# Patient Record
Sex: Female | Born: 1940 | Race: White | Hispanic: No | Marital: Married | Smoking: Never smoker
Health system: Southern US, Community
[De-identification: ages and names within clinical notes are randomized; demographics above are authoritative.]

## PROBLEM LIST (undated history)

## (undated) DIAGNOSIS — R112 Nausea with vomiting, unspecified: Secondary | ICD-10-CM

## (undated) DIAGNOSIS — Z9889 Other specified postprocedural states: Secondary | ICD-10-CM

## (undated) DIAGNOSIS — I10 Essential (primary) hypertension: Secondary | ICD-10-CM

## (undated) DIAGNOSIS — H269 Unspecified cataract: Secondary | ICD-10-CM

## (undated) HISTORY — DX: Essential (primary) hypertension: I10

## (undated) HISTORY — PX: COLONOSCOPY: SHX174

## (undated) HISTORY — PX: TONSILLECTOMY: SUR1361

## (undated) HISTORY — PX: CHOLECYSTECTOMY: SHX55

## (undated) HISTORY — DX: Unspecified cataract: H26.9

---

## 2007-10-19 ENCOUNTER — Inpatient Hospital Stay (HOSPITAL_COMMUNITY): Admission: EM | Admit: 2007-10-19 | Discharge: 2007-10-20 | Payer: Self-pay | Admitting: Emergency Medicine

## 2007-10-19 IMAGING — CT CT ABDOMEN W/O CM
2 of 4 series · 17 of 46 positions shown, 19 images · non-contrast
Comparison: None available.

CT ABDOMEN

CLINICAL DATA: Back pain.

CT ABDOMEN AND PELVIS WITHOUT CONTRAST
TECHNIQUE: Multidetector CT imaging of the abdomen and pelvis was
performed following the standard
protocol without intravenous contrast.

[Series 2: 160 stone 5.0 b40f st · axial · 0.62mm/px · z∈[-260,+60]mm · 14 of 70 slices shown, 16 images]
[im 3/70  soft-tissue]
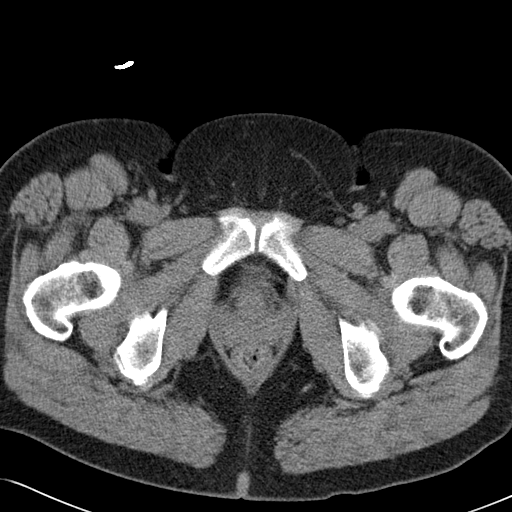
[im 3/70  bone]
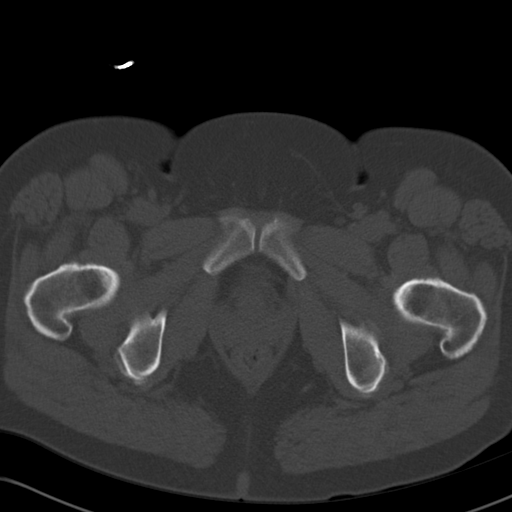
[im 8/70  soft-tissue]
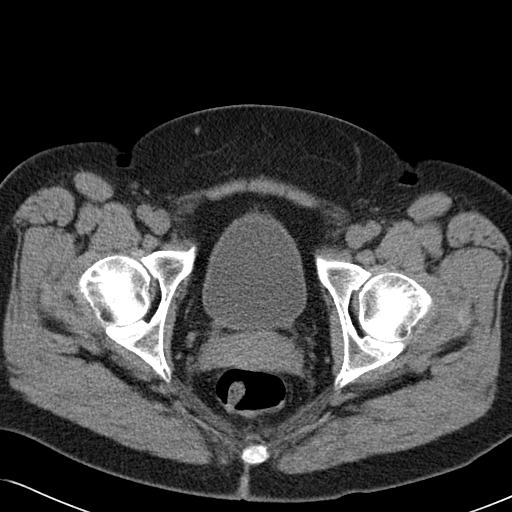
[im 13/70  soft-tissue]
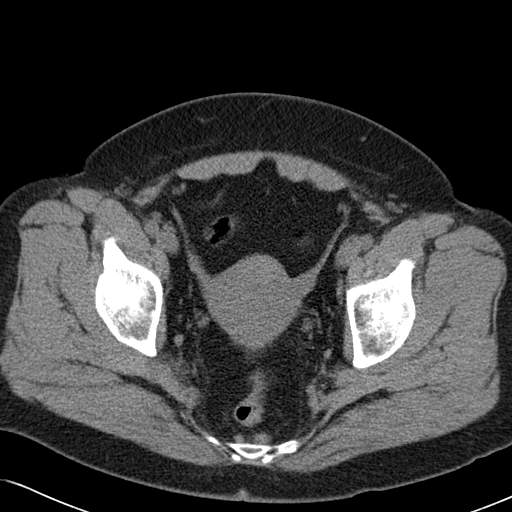
[im 18/70  soft-tissue]
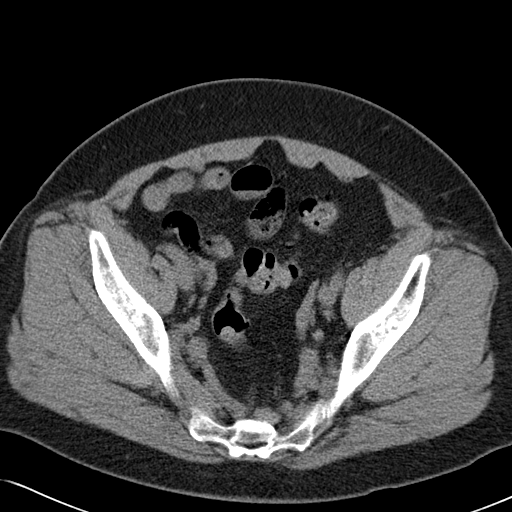
[im 24/70  soft-tissue]
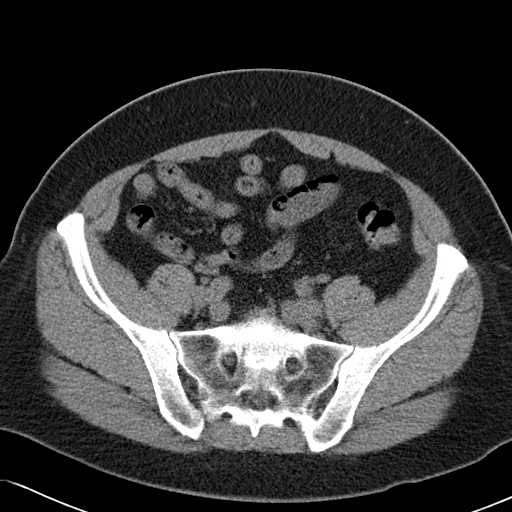
[im 29/70  soft-tissue]
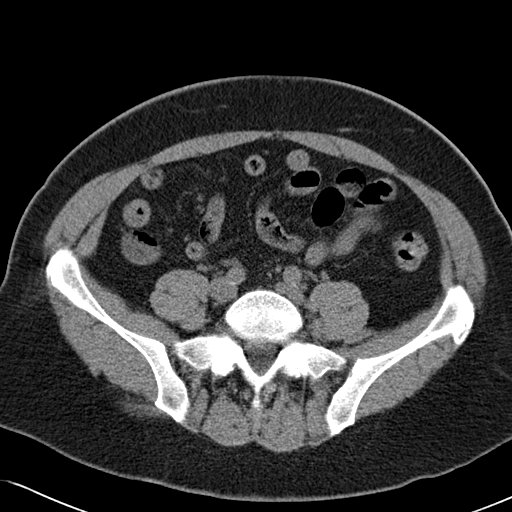
[im 34/70  soft-tissue]
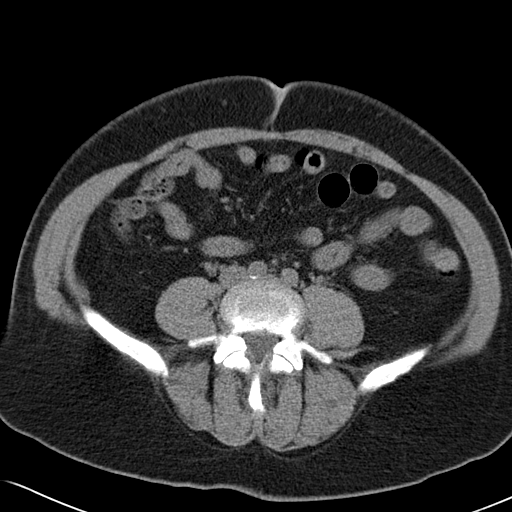
[im 36/70  soft-tissue]
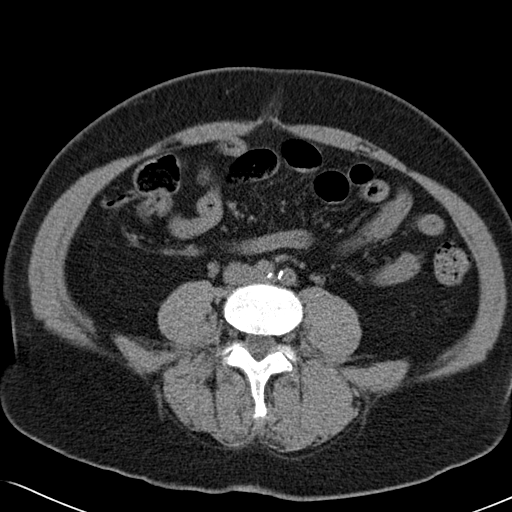
[im 41/70  soft-tissue]
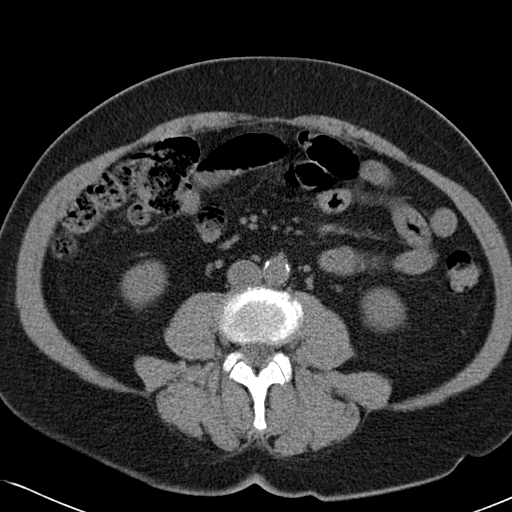
[im 41/70  bone]
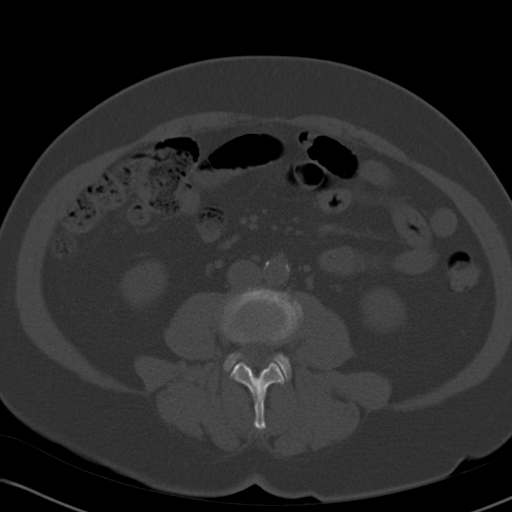
[im 47/70  soft-tissue]
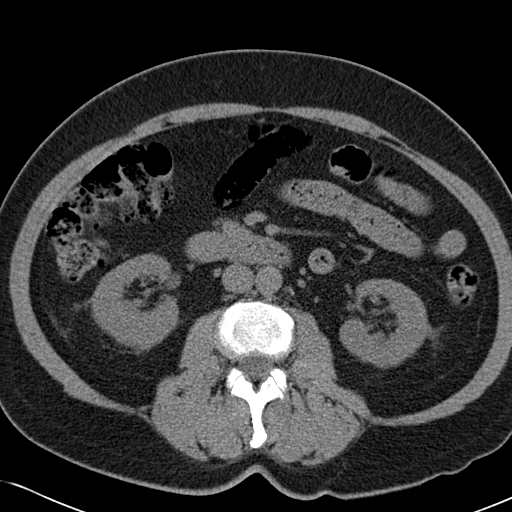
[im 52/70  soft-tissue]
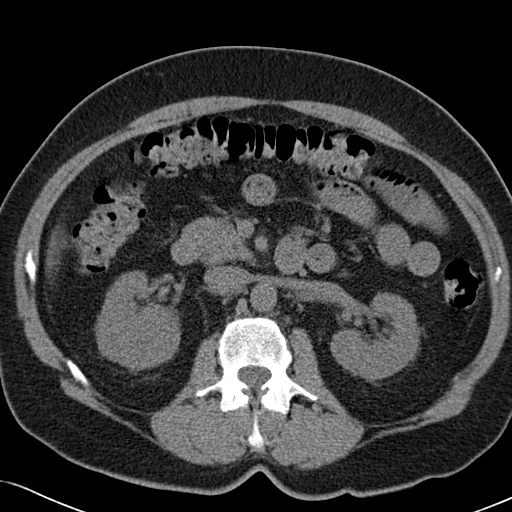
[im 57/70  soft-tissue]
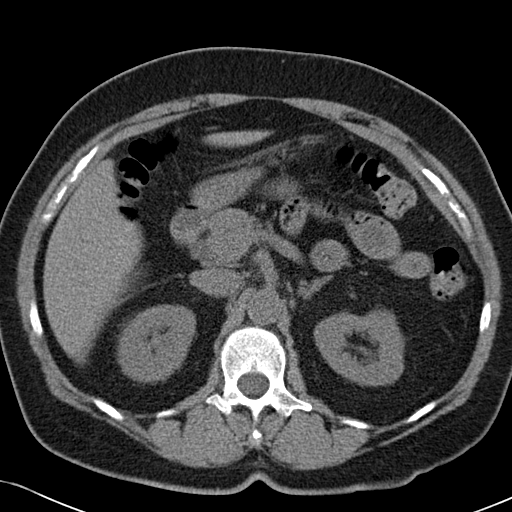
[im 62/70  soft-tissue]
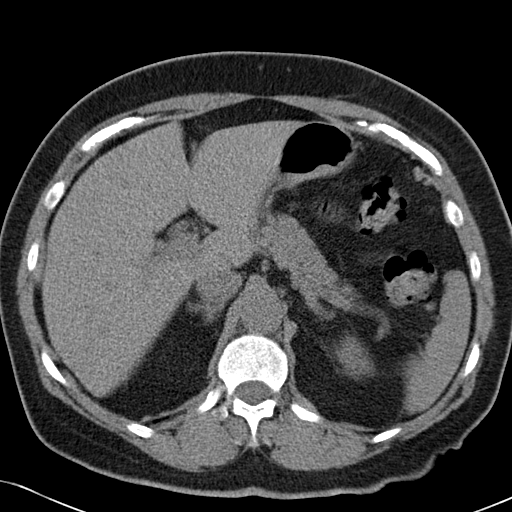
[im 67/70  soft-tissue]
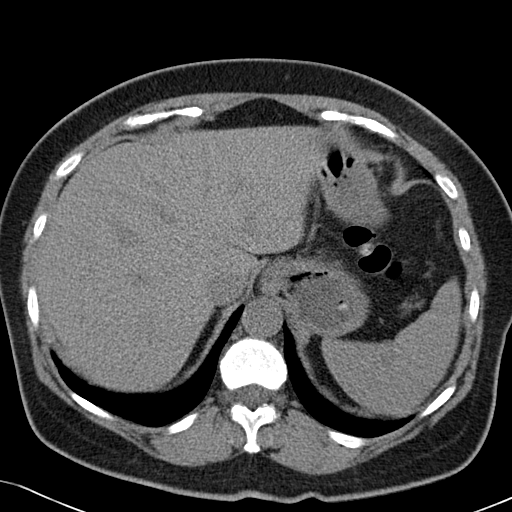

[Series 602: <mpr thick range> · coronal · 0.71mm/px · 3 of 72 slices shown]
[im 24/72  soft-tissue]
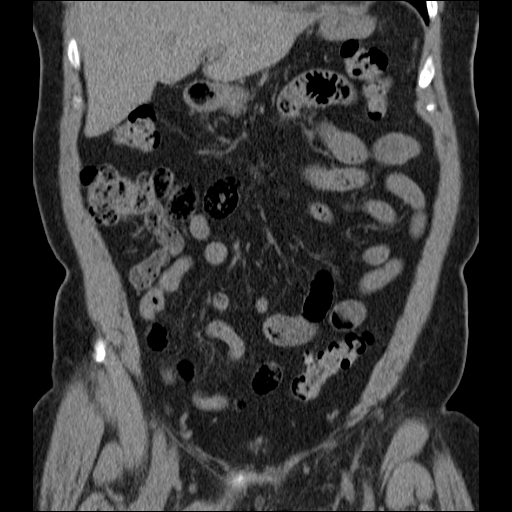
[im 32/72  soft-tissue]
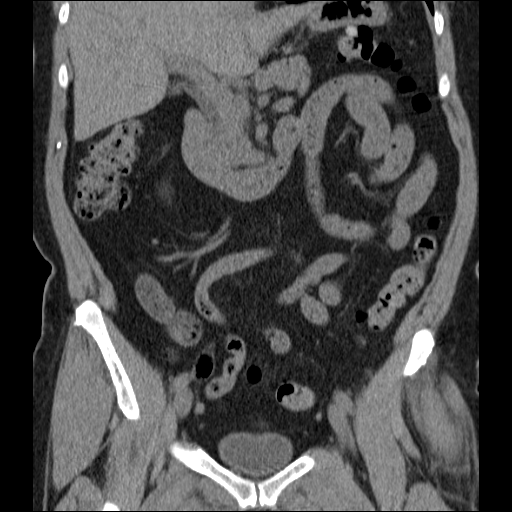
[im 40/72  soft-tissue]
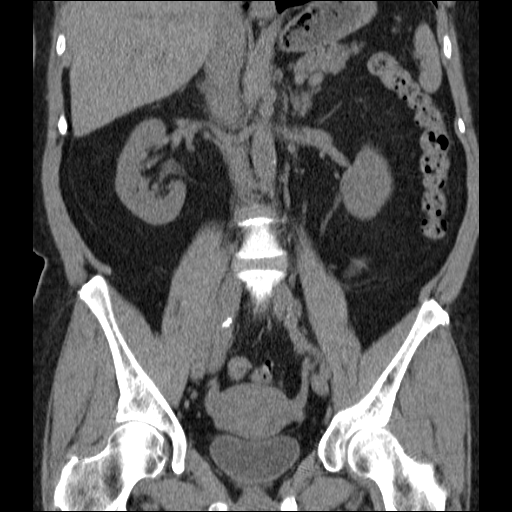

[17 of 46 positions shown; findings below may reference images not displayed]

FINDINGS: Minimal amount of lung parenchyma is imaged and is
clear.  No pleural effusion.

The kidneys and ureters have a normal uninfused appearance
bilaterally.  No hydronephrosis or urinary tract stones.  The
uninfused appearance of the imaged portions of the liver, spleen,
adrenal glands and pancreas is normal.  The patient is status post
cholecystectomy.  There is no abdominal lymphadenopathy or fluid
collection.  No focal bony abnormality.
IMPRESSION: Negative for urinary tract stones or hydronephrosis.  No acute
finding.

CT PELVIS
FINDINGS: The uterus and adnexa appear normal.  There is no pelvic
fluid or lymphadenopathy.  A few diverticula are seen in the
sigmoid colon but there is no evidence of diverticulitis.  No focal
bony abnormality.
IMPRESSION: Negative pelvic CT.

## 2007-10-19 IMAGING — CR DG CHEST 2V
2 series · 2 of 2 positions shown · non-contrast
Comparison: None available.

CLINICAL DATA: Back pain.

CHEST - 2 VIEW

[w chest pa]
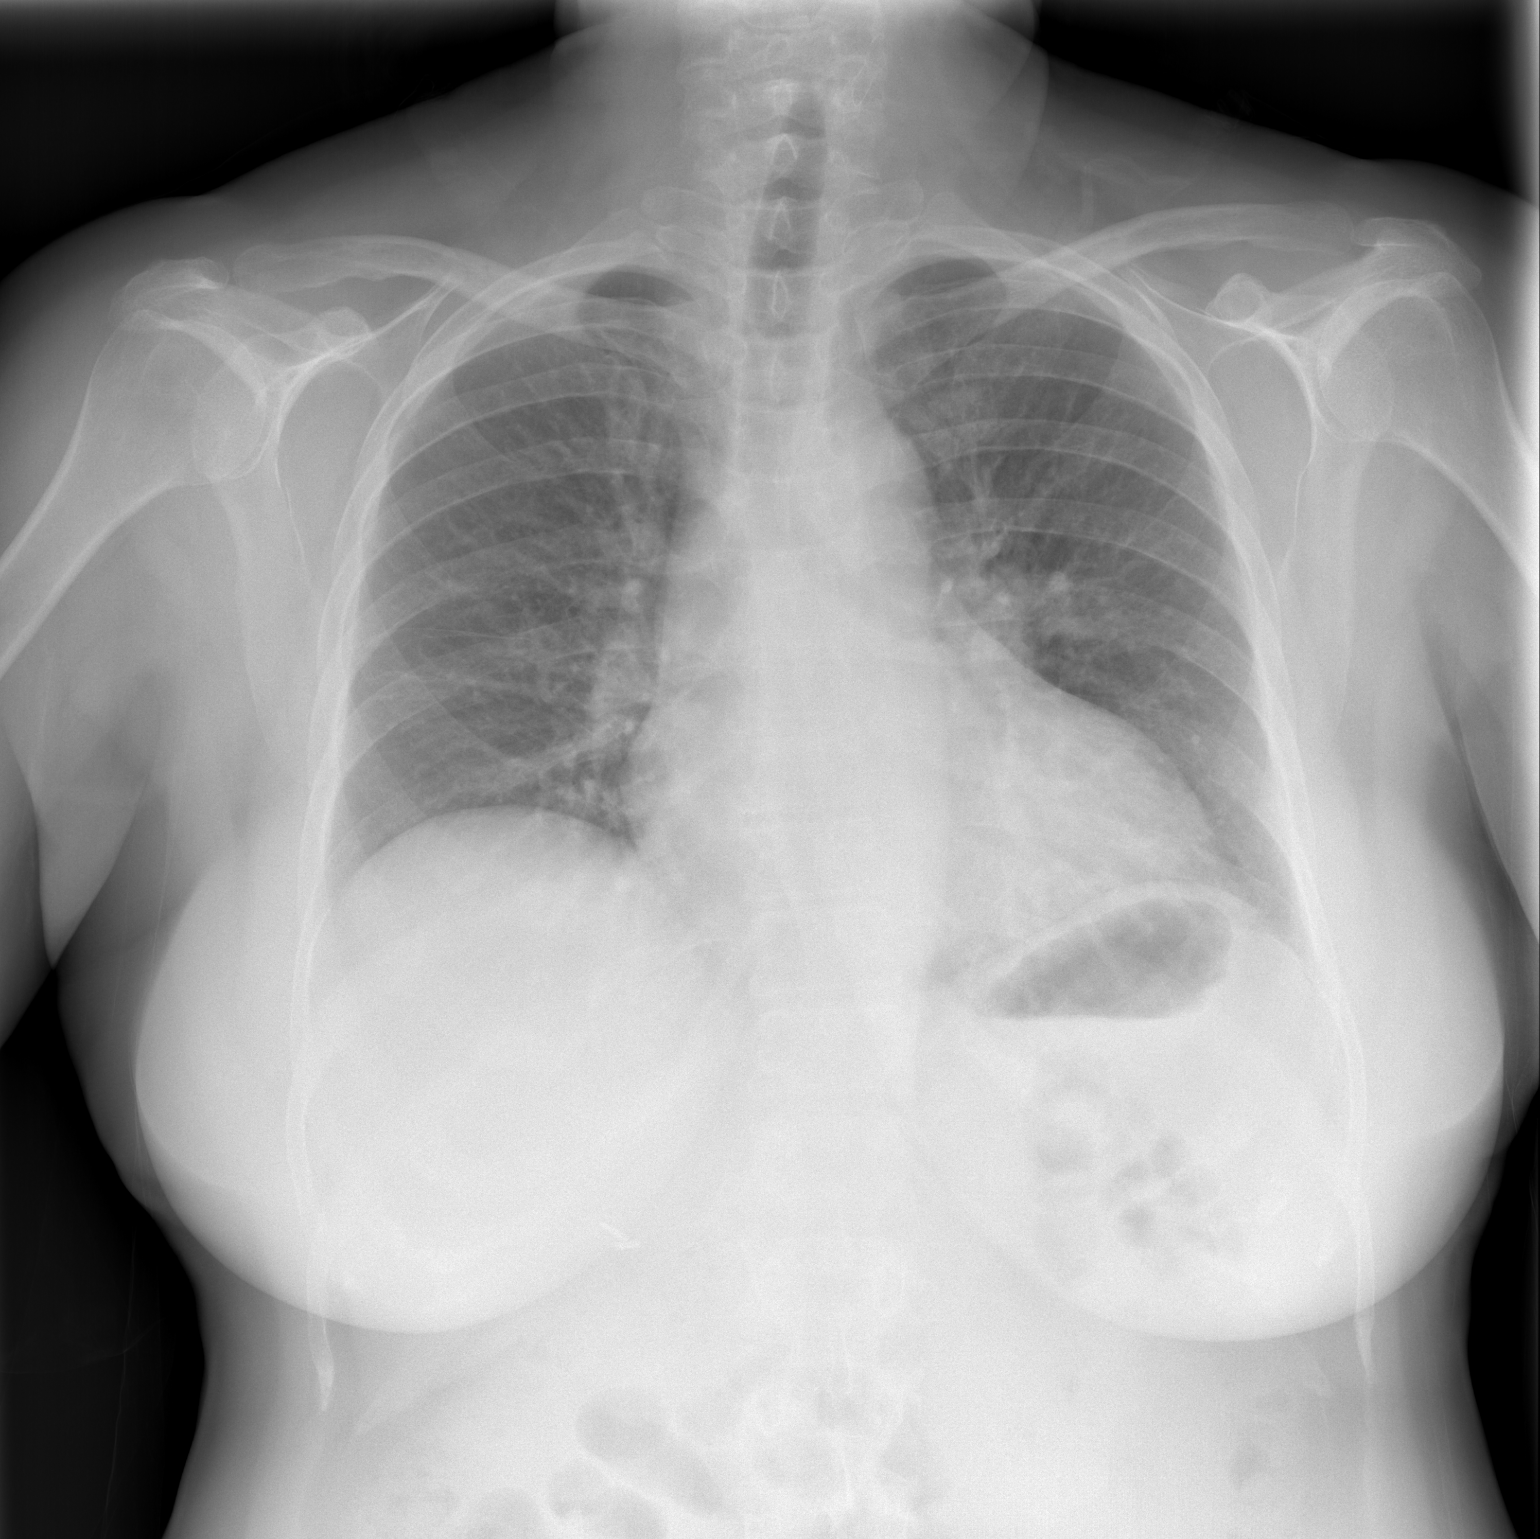

[w chest lat]
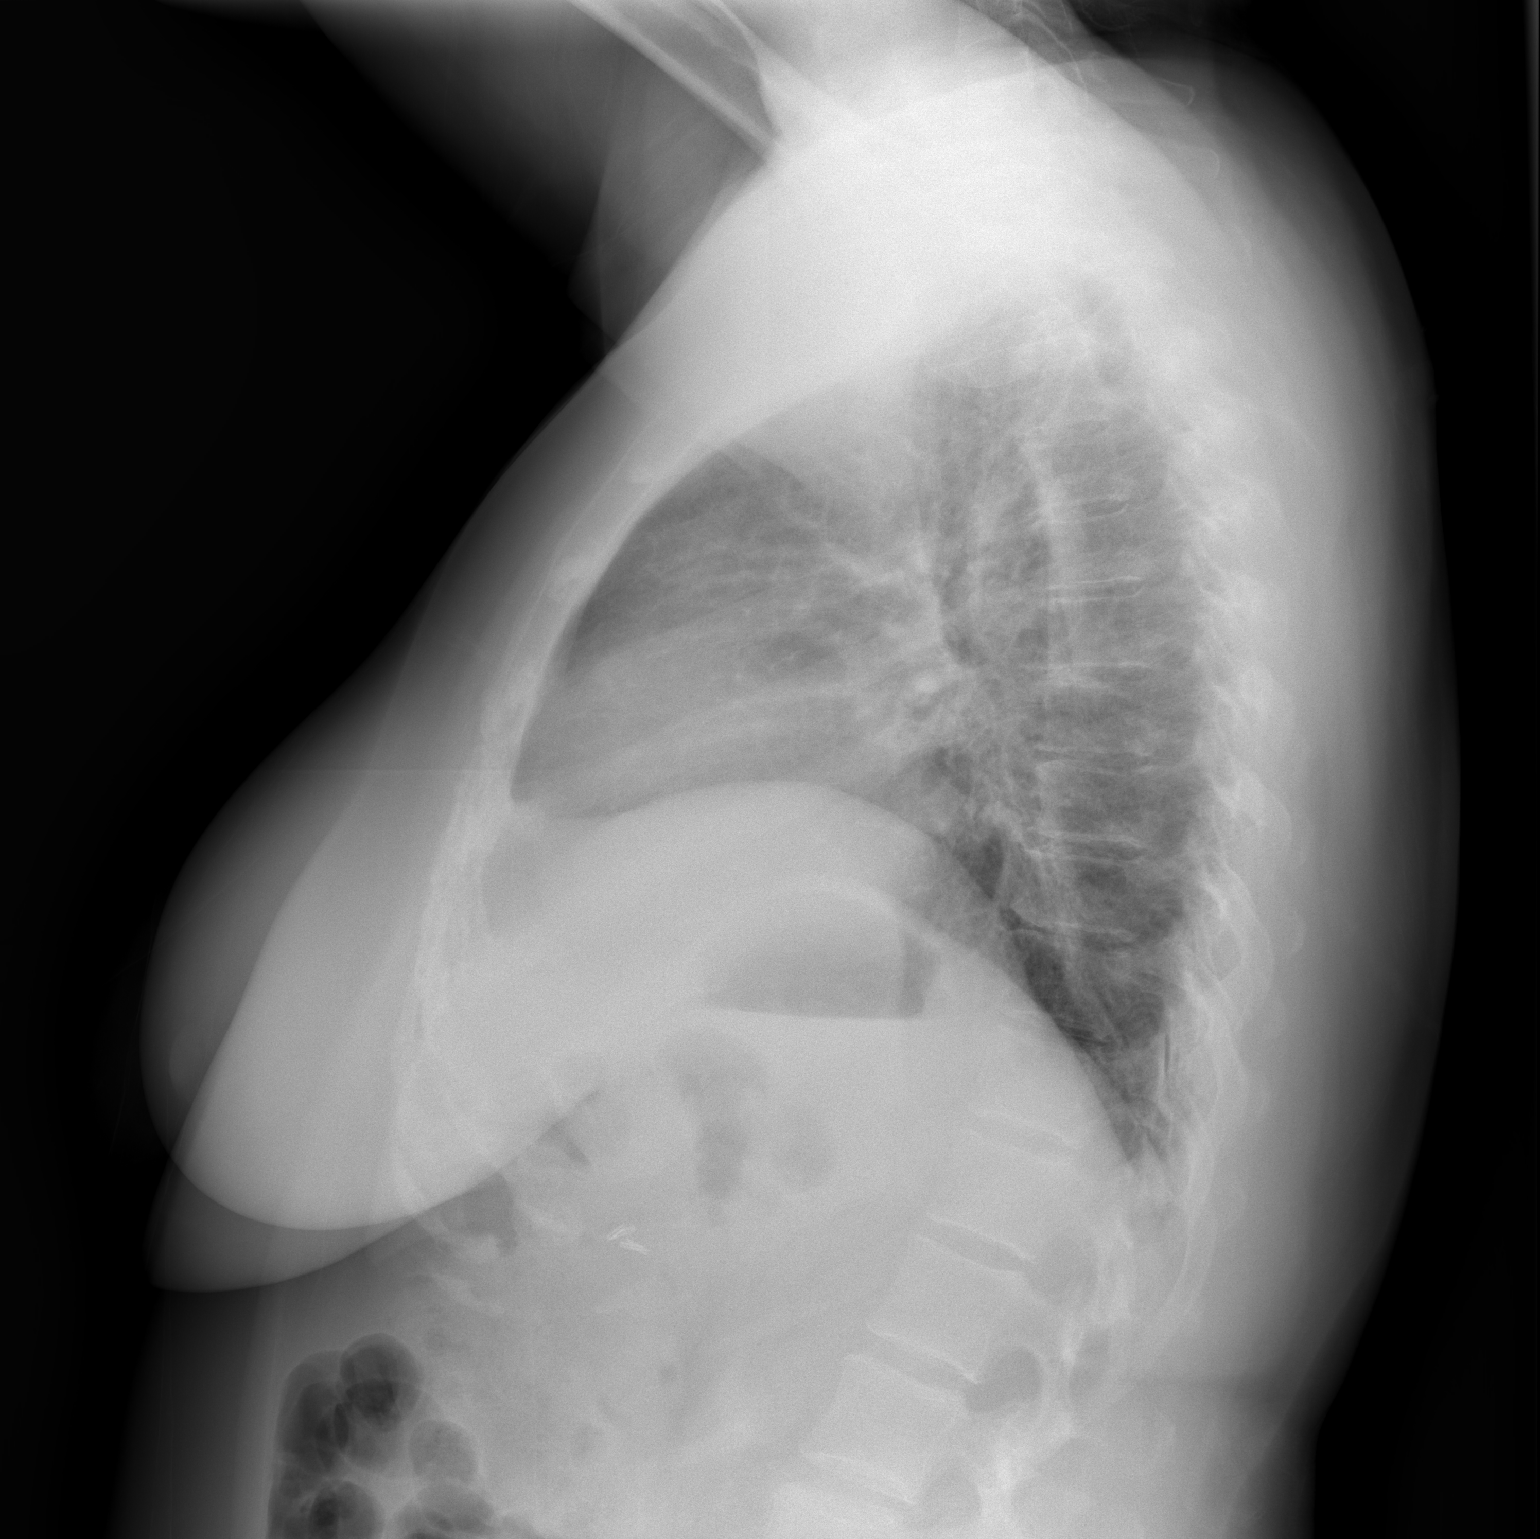

[2 of 2 positions shown; findings below may reference images not displayed]

FINDINGS: Lung volumes are low.  There is mild cardiomegaly
pulmonary vascular congestion.  No effusion or focal airspace
disease.
IMPRESSION: Cardiomegaly pulmonary vascular congestion.  Otherwise negative.

## 2007-10-19 IMAGING — US US ABDOMEN COMPLETE
1 series · 14 of 25 positions shown · non-contrast
Comparison: CT 10/19/2007.

CLINICAL DATA: History cholecystectomy.  Evaluate for retained
stones.

ABDOMEN ULTRASOUND
TECHNIQUE: Complete abdominal ultrasound examination was performed
including evaluation of the liver, gallbladder, bile ducts,
pancreas, kidneys, spleen, IVC, and abdominal aorta.

[Series 1: unknown · 0.37mm/px · 14 of 48 slices shown]
[im 1/48]
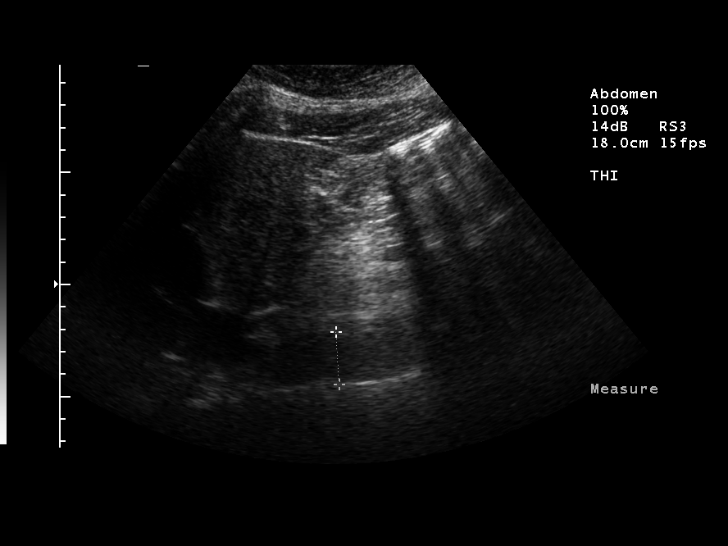
[im 4/48]
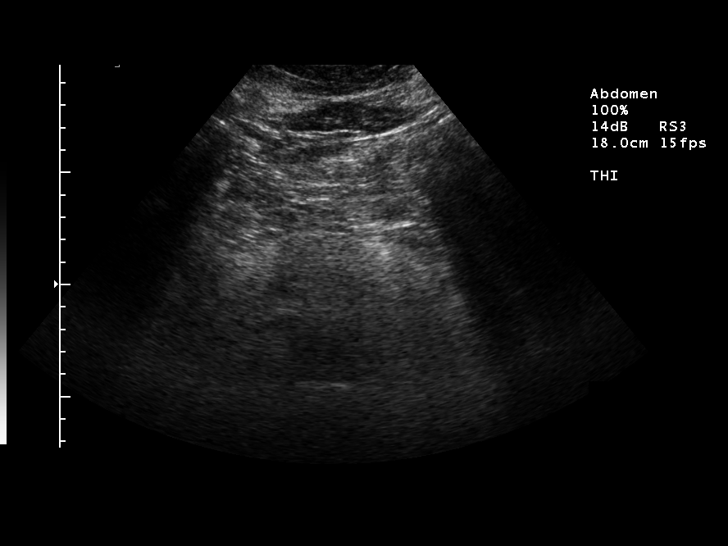
[im 8/48]
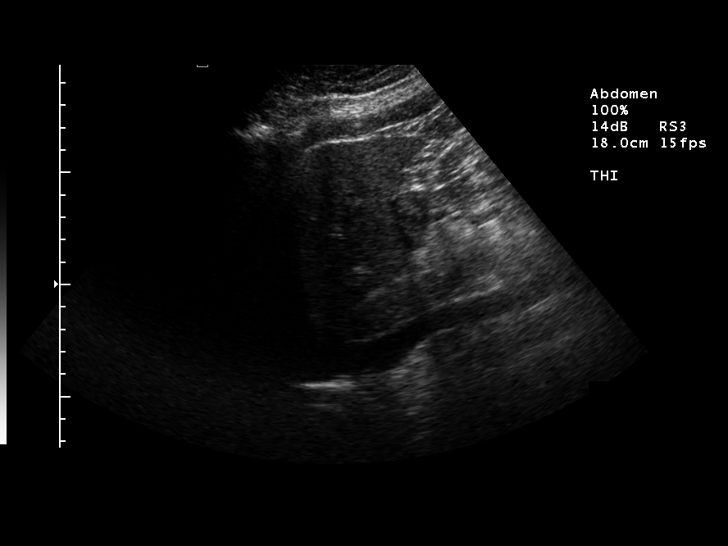
[im 12/48]
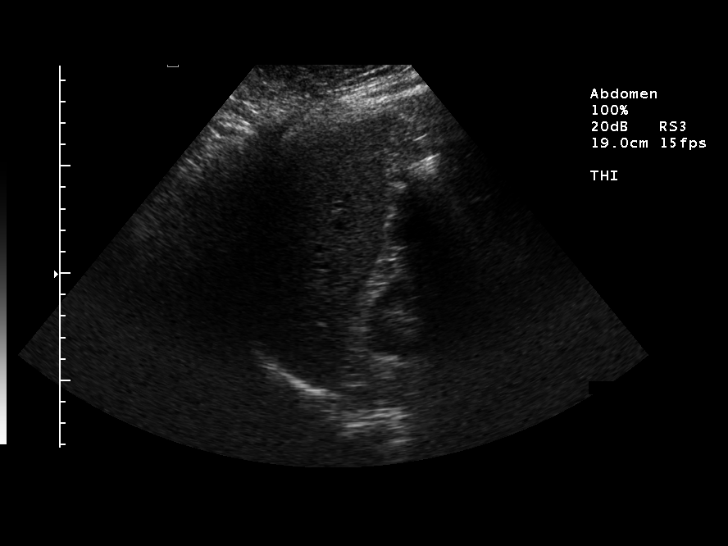
[im 16/48]
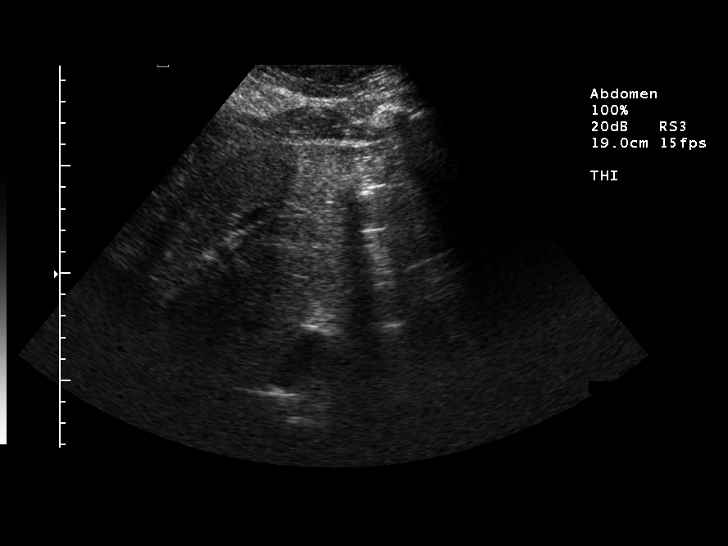
[im 18/48]
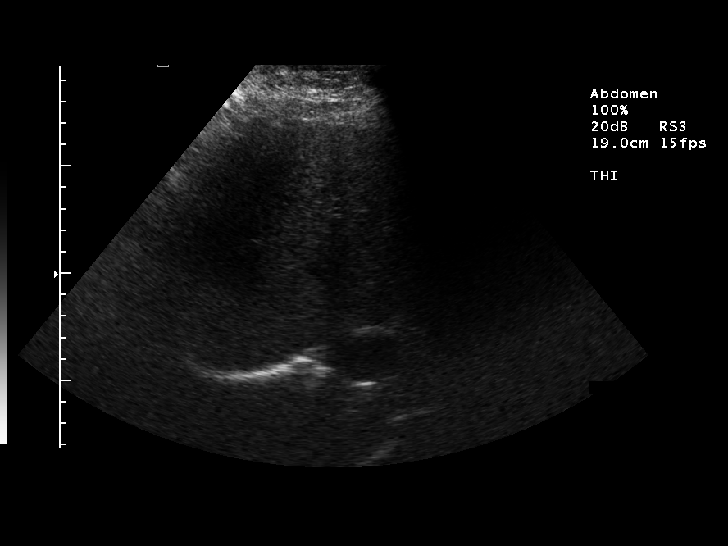
[im 22/48]
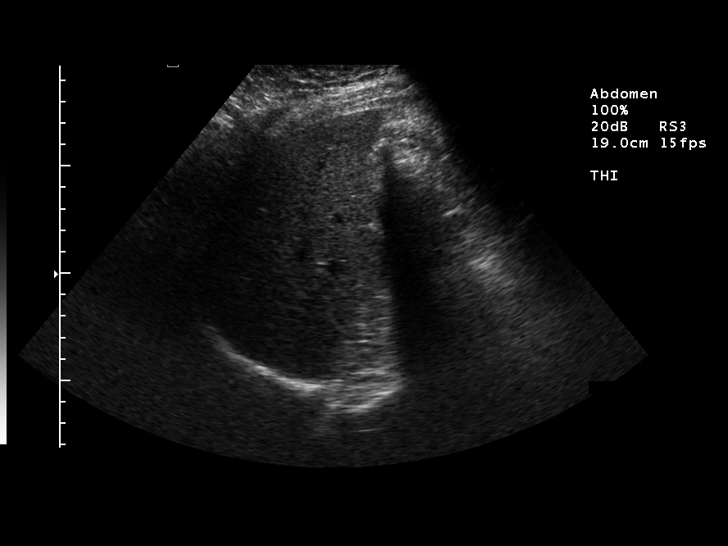
[im 26/48]
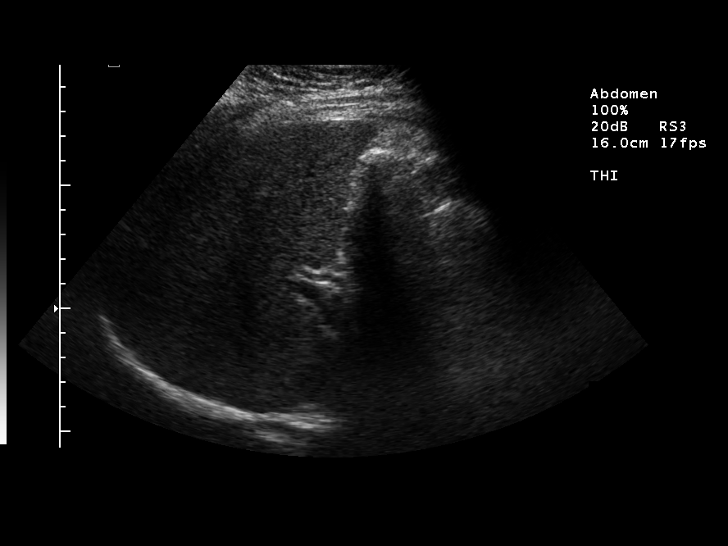
[im 30/48]
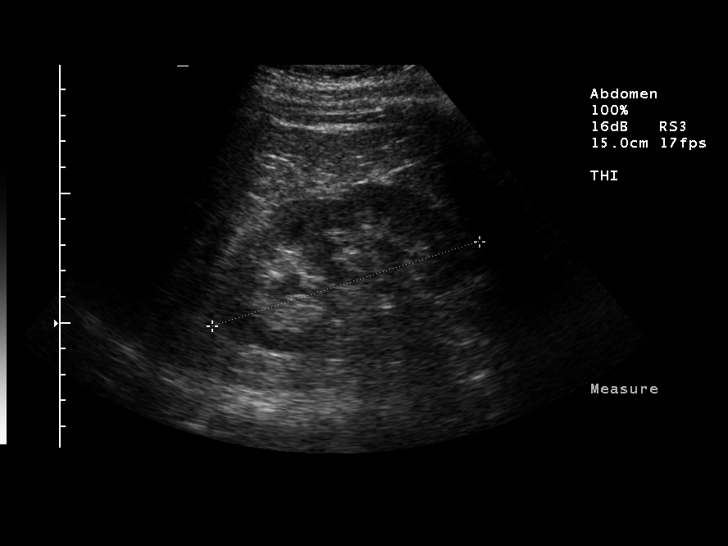
[im 32/48]
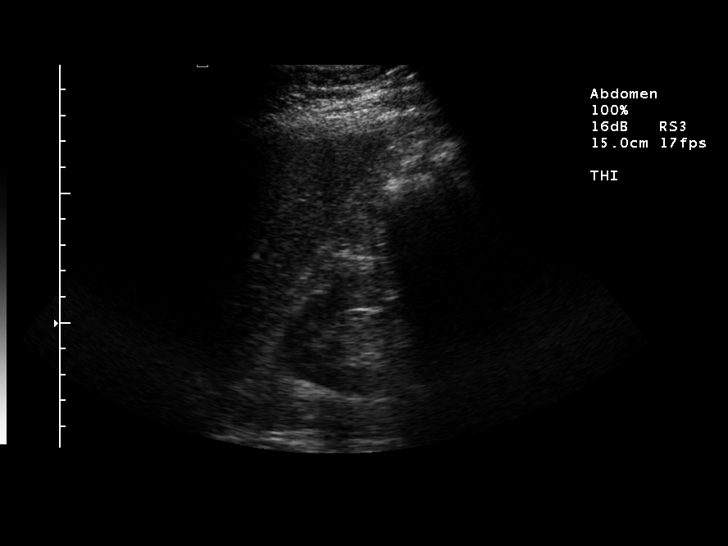
[im 36/48]
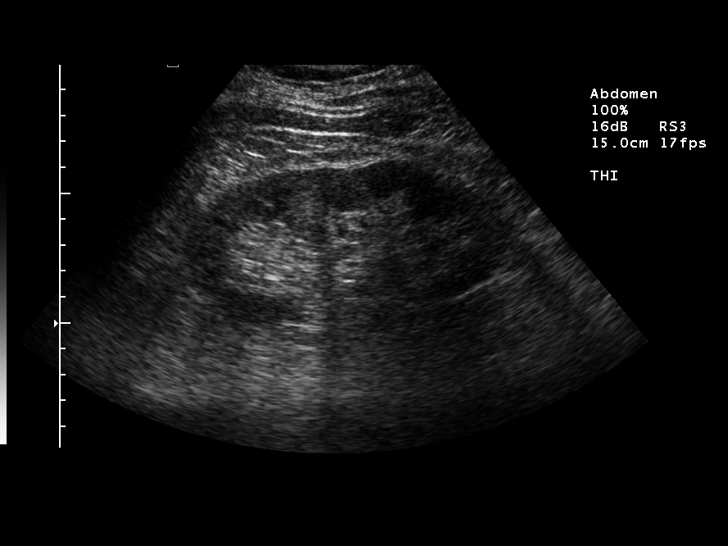
[im 40/48]
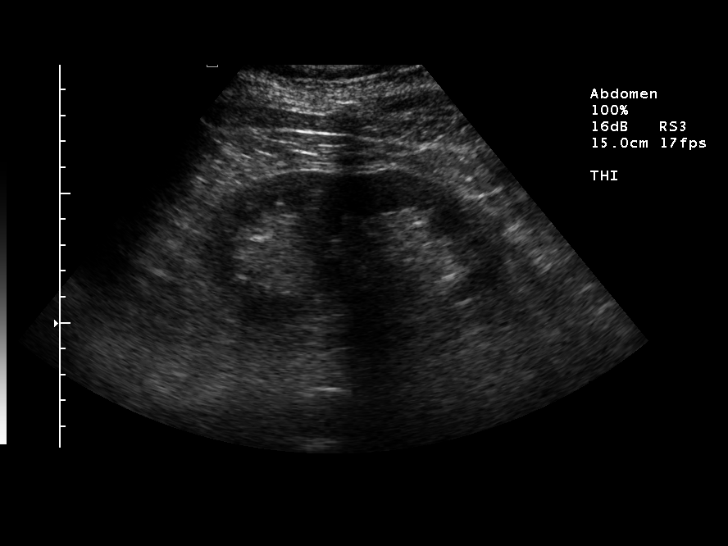
[im 44/48]
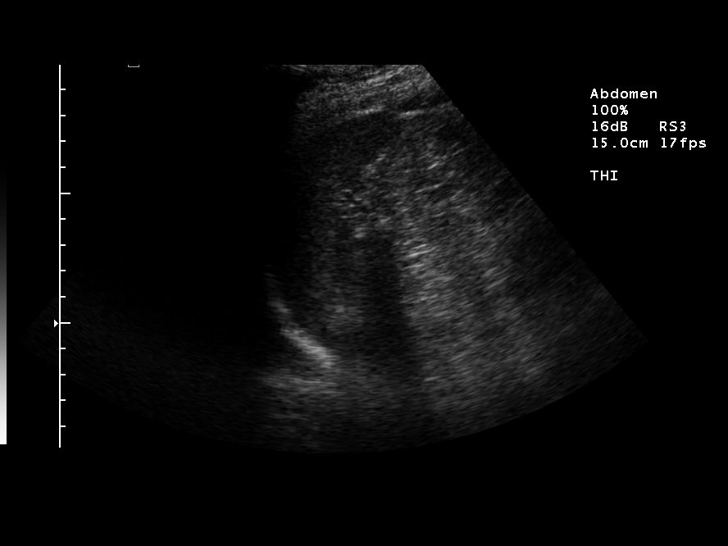
[im 48/48]
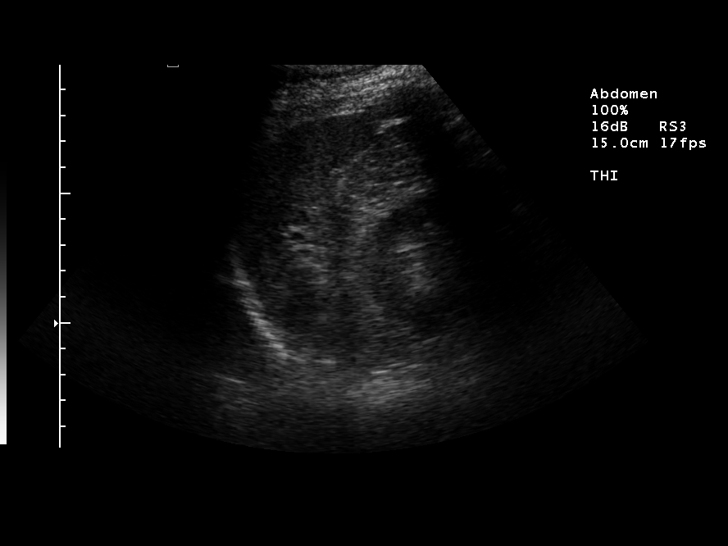

[14 of 25 positions shown; findings below may reference images not displayed]

FINDINGS: Status post cholecystectomy.  Common bile duct varies
between 5.8 mm and 6.6 mm without evidence retained stone.  The
distal aspect of the common bile duct as well as the pancreas are
poorly delineated secondary to overlying bowel gas.  No focal
hepatic lesion or intrahepatic biliary duct dilatation.

Right kidney 10.8 cm and left kidney 12.5 cm length without focal
renal mass or hydronephrosis.  Spleen unremarkable.

Atherosclerotic type changes of the abdominal aorta.  Portions of
the abdominal aorta are not well delineated secondary to overlying
bowel gas.  Portions which are visualized appear unremarkable.
Inferior vena cava within normal limits.
IMPRESSION: Status post cholecystectomy.  No common bile duct stones are noted
however the distal common bile duct and pancreas are poorly
delineated secondary to overlying bowel gas.

## 2010-05-02 HISTORY — PX: MELANOMA EXCISION: SHX5266

## 2010-09-14 NOTE — H&P (Signed)
NAMETANNYA, GONET               ACCOUNT NO.:  [id number]   MEDICAL RECORD NO.:  [id number]          PATIENT TYPE:  INP   LOCATION:  0110                         FACILITY:  [facility]   PHYSICIAN:  Hillery Aldo, M.D.   DATE OF BIRTH:  04-Jun-1940   DATE OF ADMISSION:  10/19/2007  DATE OF DISCHARGE:                              HISTORY & PHYSICAL   PRIMARY CARE PHYSICIAN:  None locally.   CHIEF COMPLAINT:  Left flank pain.   HISTORY OF PRESENT ILLNESS:  The patient is a 70 year old female who was  awakened at 1 o'clock in the morning with a complaint of back and left-  sided flank pain.  She got up and took aspirin as well as Advil with no  relief of the pain.  Pain was associated with nausea but no frank  vomiting.  She also had the sensation of feeling cold and clammy, but no  frank fevers.  She has not had any diarrhea, melena or hematochezia.  She presented to the hospital this morning for evaluation and was found  to have evidence of pancreatitis, therefore is being admitted.   PAST MEDICAL HISTORY:  1. Hypertension.  2. Dyslipidemia.  3. Diverticulosis by CT scanning.   PAST SURGICAL HISTORY:  Cholecystectomy.   FAMILY HISTORY:  Patient's mother died at 49 from a possible adverse  drug reaction.  She had a history of coronary artery disease and  myocardial infarction.  Patient's father died at 22 from an acute MI.  She has 1 brother who had diabetes and a sister who has been diagnosed  with bronchiolitis obliterans obstructing pneumonia.  She has 1 healthy  son.   SOCIAL HISTORY:  The patient is married.  She moved to [place]  approximately 1 year ago from [place] and does not have a local  physician.  She is a lifelong nonsmoker.  She drinks 2 to 3 alcoholic  beverages per month.  No drug use.  She is retired from working at an  [employer] firm.   ALLERGIES:  No known drug allergies.   MEDICATIONS:  1. Benicar 40 mg daily.  2. Vytorin 10/40 three times  per week on Monday, Wednesday, and      Friday.  3. Metamucil daily.   REVIEW OF SYSTEMS:  As noted in the elements of the HPI above.  The  patient's appetite has been normal prior to the acute onset of this  illness.  No chest pain, shortness of breath, or other symptoms.   PHYSICAL EXAMINATION:  VITAL SIGNS:  Temperature 97.1, pulse 60,  respirations 18, blood pressure 142/66, O2 saturation 99% on room air.  GENERAL:  Slightly obese female in no acute distress.  HEENT:  Normocephalic, atraumatic.  PERRLA.  EOMI.  Oropharynx is clear.  NECK:  Supple, no thyromegaly, no lymphadenopathy, no jugular venous  distension.  CHEST:  Clear to auscultation bilaterally with good air movement.  HEART:  Regular rate, rhythm.  No murmurs, rubs, or gallops.  ABDOMEN:  Soft, nontender, nondistended with normoactive bowel sounds.  EXTREMITIES:  No clubbing, edema, or cyanosis.  SKIN:  Warm  and dry.  No rashes.  NEUROLOGIC:  The patient is alert and oriented x3.  Cranial nerves II-  XII grossly intact.  Moves all extremities x4 with equal strength.  Nonfocal.   DATA REVIEW:  Chest x-ray shows cardiomegaly with pulmonary vascular  congestion.   CT scan of the pelvis shows no evidence of nephrolithiasis and no  hydronephrosis.   LABORATORY DATA:  Sodium is 139, potassium 4.0, chloride 106, bicarb 19,  BUN 19, creatinine 0.9, glucose 119, liver function studies are within  normal limits.  White blood cell count is 8.2, hemoglobin 14.3,  hematocrit 42.6, platelets 209.  Lipase is 105.  Point of care cardiac  markers negative.  Urinalysis is negative for blood and glucose with no  signs of infection.   ASSESSMENT AND PLAN:  1. Acute pancreatitis:  We will admit the patient and put her on bowel      rest.  We will administer IV fluids for hydration purposes.  Will      administer pain medications as needed for pain control and      antiemetics as needed for nausea.  Patient has a history of having       had a cholecystectomy likely for cholelithiasis, and therefore will      check a right upper quadrant ultrasound to rule out any retained      stones in the ductwork.  Will check a fasting lipid panel to rule      out hypertriglyceridemia as the underlying etiology.  In the      absence of any further data, this may have been an adverse reaction      to Vytorin, and therefore would discontinue this medication.  2. Hypertension:  Continue the patient's Benicar.  3. Dyslipidemia:  Will check a fasting lipid panel.  Vytorin does have      pancreatitis listed as a possible adverse reaction, therefore we      will hold this medication.  4. Prophylaxis:  The patient is an ambulatory female, and will      encourage early ambulation for deep venous thrombosis prophylaxis.      Will put her on Protonix for gastrointestinal stress also      prophylaxis.      Hillery Aldo, M.D.  Electronically Signed     CR/MEDQ  D:  10/19/2007  T:  10/19/2007  Job:  [id number]

## 2010-09-17 NOTE — Discharge Summary (Signed)
NAMEJENNESSA, Powers               ACCOUNT NO.:  [id number]   MEDICAL RECORD NO.:  [id number]          PATIENT TYPE:  INP   LOCATION:  1506                         FACILITY:  [facility]   PHYSICIAN:  Wilson Singer, M.D.DATE OF BIRTH:  Apr 16, 1941   DATE OF ADMISSION:  10/19/2007  DATE OF DISCHARGE:  10/20/2007                               DISCHARGE SUMMARY   FINAL DISCHARGE DIAGNOSIS:  1. Left-sided abdominal pain.  2. Acute pancreatitis.  3. Hypertension.  4. Hyperlipidemia   CONDITION ON DISCHARGE:  Stable.   MEDICATIONS ON DISCHARGE:  1. Benicar 40 mg daily.  2. Metamucil daily.   Note:  Stopped Vytorin.   HISTORY:  This 70 year old lady was admitted with left-sided flank pain.  Please see initial history and physical examination.   HOSPITAL PROGRESS:  On admission her lipase was elevated somewhat to 105  and the working diagnosis was acute pancreatitis.  She underwent  ultrasound of the abdomen which did not show evidence of any gallstones,  and a CT of the abdomen and pelvis was negative.  The day following  discharge she was completely improved with no pain and no nausea and  vomiting and she was tolerating a diet.   PHYSICAL EXAMINATION:  VITAL SIGNS:  Temperature 98.4, blood pressure  145/79, pulse 50, saturation 98% on room air.  ABDOMEN: Her abdomen was soft and nontender.   Investigations showed lipase decreased to 22, sodium 140, potassium 3.9,  bicarbonate 28, BUN 8, creatinine 0.72, hemoglobin 12.3, white blood  cell count 6.5, platelets 155.   FURTHER DISPOSITION:  It is possible that the cause of the pancreatitis  may have been her statin medications, and this is why her Vytorin was  discontinued at this point, and followup will be with her primary care  physician, Dr. Ralene Ok.      Wilson Singer, M.D.  Electronically Signed     NCG/MEDQ  D:  11/08/2007  T:  11/08/2007  Job:  [id number]   cc:   Ralene Ok, M.D.  Fax: [phone]

## 2011-01-27 LAB — POCT I-STAT, CHEM 8
BUN: 19
Creatinine, Ser: 0.9
Hemoglobin: 14.6
Potassium: 4
Sodium: 139
TCO2: 24

## 2011-01-27 LAB — CBC
HCT: 36.6
Hemoglobin: 12.3
Hemoglobin: 14.3
MCHC: 33.6
MCV: 90.8
RBC: 4.71
RDW: 13.4
WBC: 8.2

## 2011-01-27 LAB — POCT CARDIAC MARKERS
Operator id: 290111
Troponin i, poc: <0.05

## 2011-01-27 LAB — DIFFERENTIAL
Basophils Absolute: 0
Basophils Relative: 0
Eosinophils Absolute: 0.2
Eosinophils Relative: 3
Lymphs Abs: 2.7
Neutrophils Relative %: 58

## 2011-01-27 LAB — URINALYSIS, ROUTINE W REFLEX MICROSCOPIC
Glucose, UA: NEGATIVE
Hgb urine dipstick: NEGATIVE
Protein, ur: NEGATIVE
Specific Gravity, Urine: 1.023
Urobilinogen, UA: 0.2

## 2011-01-27 LAB — COMPREHENSIVE METABOLIC PANEL
ALT: 30
AST: 28
Alkaline Phosphatase: 49
Alkaline Phosphatase: 61
BUN: 8
CO2: 26
Chloride: 107
Creatinine, Ser: 0.72
GFR calc Af Amer: >60
GFR calc non Af Amer: >60
Glucose, Bld: 110 — ABNORMAL HIGH
Glucose, Bld: 86
Potassium: 3.9
Potassium: 3.9
Sodium: 141
Total Bilirubin: 0.8
Total Bilirubin: 0.8
Total Protein: 5.2 — ABNORMAL LOW

## 2011-01-27 LAB — LIPASE, BLOOD
Lipase: 105 — ABNORMAL HIGH
Lipase: 22

## 2011-01-27 LAB — LIPID PANEL
Cholesterol: 129
HDL: 29 — ABNORMAL LOW
LDL Cholesterol: 77

## 2011-02-08 ENCOUNTER — Emergency Department (HOSPITAL_COMMUNITY)
Admission: EM | Admit: 2011-02-08 | Discharge: 2011-02-08 | Disposition: A | Discharge status: Home / Self Care | Payer: Medicare Other | Attending: Emergency Medicine | Admitting: Emergency Medicine

## 2011-02-08 DIAGNOSIS — M545 Low back pain, unspecified: Secondary | ICD-10-CM | POA: Insufficient documentation

## 2011-02-08 DIAGNOSIS — I10 Essential (primary) hypertension: Secondary | ICD-10-CM | POA: Insufficient documentation

## 2011-08-16 ENCOUNTER — Encounter: Payer: Self-pay | Admitting: Gynecology

## 2011-08-16 ENCOUNTER — Ambulatory Visit: Payer: Medicare Other | Attending: Gynecology | Admitting: Gynecology

## 2011-08-16 VITALS — BP 170/86 | HR 78 | Temp 98.1°F | Resp 16 | Ht 61.77 in | Wt 172.6 lb

## 2011-08-16 DIAGNOSIS — Z79899 Other long term (current) drug therapy: Secondary | ICD-10-CM | POA: Insufficient documentation

## 2011-08-16 DIAGNOSIS — C549 Malignant neoplasm of corpus uteri, unspecified: Secondary | ICD-10-CM | POA: Insufficient documentation

## 2011-08-16 DIAGNOSIS — I10 Essential (primary) hypertension: Secondary | ICD-10-CM | POA: Insufficient documentation

## 2011-08-16 DIAGNOSIS — C541 Malignant neoplasm of endometrium: Secondary | ICD-10-CM | POA: Insufficient documentation

## 2011-08-16 NOTE — Progress Notes (Signed)
H&P   Glenda Powers 71 y.o. female  Chief Complaint  Patient presents with  . Endometrial cancer    New consult    History of Present Illness:  Seen in consultation at the request of Dr. Scott Powers regarding the management of a newly diagnosed endometrial cancer. Developed postmenopausal bleeding and underwent an endometrial biopsy.  Path showed Grade 1 endometrial cancer. No significant gyn history.  Has a recent history of melanoma of the scalp (currently NED) Denies any GI or GU symptoms or weight loss or pain.  Past Medical History  Diagnosis Date  . Hypertension   . Endometrioid carcinoma    Past Surgical History  Procedure Date  . Melanoma excision 2012    from top of head  . Cholecystectomy     18 years ago  . Tonsillectomy     as a child   Current Outpatient Prescriptions  Medication Sig Dispense Refill  . atorvastatin (LIPITOR) 10 MG tablet Take 10 mg by mouth daily.      . lisinopril (PRINIVIL,ZESTRIL) 10 MG tablet Take 10 mg by mouth daily.      . psyllium (REGULOID) 0.52 G capsule Take 2 capsules by mouth daily.       No Known Allergies History   Social History  . Marital Status: Married    Spouse Name: N/A    Number of Children: N/A  . Years of Education: N/A   Occupational History  . Not on file.   Social History Main Topics  . Smoking status: Never Smoker   . Smokeless tobacco: Not on file  . Alcohol Use: Yes     occassional  . Drug Use: No  . Sexually Active: Yes   Other Topics Concern  . Not on file   Social History Narrative  . No narrative on file   Family History  Problem Relation Age of Onset  . Cancer Maternal Aunt     Pertinent Gynecological History: negative ROS: 10 point review is negative except as noted above. Vitals:  Blood pressure 170/86, pulse 78, temperature 98.1 F (36.7 C), temperature source Oral, resp. rate 16, height 5' 1.77" (1.569 m), weight 172 lb 9.6 oz (78.291 kg).  Physical Exam: In general the patient  is a healthy WF in no distress HEENT: negative Neck: supple  No thryomegally Nodes: supraclavicular and inguinal normal Abdomen: soft, nontender, no masses, no organomegally or ascites. Pelvic: EGBUS: normal Vagina: normal no lesions or blood Cervix: normal Uterus:  Normal shape, size and consistency Adenxa without masses or tenderness RV: confirms Extremities: normal. 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  Discussed natural history  And treatment with patient, husband and friend.  I have recommended a minimally invasive hysterectomy, BSO and possible lymphadenectomy depending on depth of invasion.  Minimally invasive surgery is her preference and we will arrange for either Dr. Gehrig or Brewster to perform the procedure.  The patient and her husband are in agreement with this plan. Risks of surgery discussed including hemorrhage, infection, transfusion, injury to adjacent viscrea, thromboembolic complications and anesthetic risks.  All questions are answered.   CLARKE-PEARSON,Tyjah Hai L, MD 08/16/2011, 3:19 PM      

## 2011-08-16 NOTE — Patient Instructions (Signed)
Will plan robotic hysterectomy, and bilateral salpingoophorectomy.  Possible removal of lymphnodes.  We will contact you to arrange preoperative evaluation.

## 2011-09-01 ENCOUNTER — Encounter (HOSPITAL_COMMUNITY): Payer: Self-pay | Admitting: Pharmacy Technician

## 2011-09-02 ENCOUNTER — Encounter (HOSPITAL_COMMUNITY)
Admission: RE | Admit: 2011-09-02 | Discharge: 2011-09-02 | Disposition: A | Discharge status: Home / Self Care | Payer: Medicare Other | Source: Ambulatory Visit | Attending: Obstetrics & Gynecology | Admitting: Obstetrics & Gynecology

## 2011-09-02 ENCOUNTER — Encounter (HOSPITAL_COMMUNITY): Payer: Self-pay

## 2011-09-02 ENCOUNTER — Ambulatory Visit (HOSPITAL_COMMUNITY)
Admission: RE | Admit: 2011-09-02 | Discharge: 2011-09-02 | Disposition: A | Discharge status: Home / Self Care | Payer: Medicare Other | Source: Ambulatory Visit | Attending: Gynecologic Oncology | Admitting: Gynecologic Oncology

## 2011-09-02 DIAGNOSIS — Z01812 Encounter for preprocedural laboratory examination: Secondary | ICD-10-CM | POA: Insufficient documentation

## 2011-09-02 DIAGNOSIS — C549 Malignant neoplasm of corpus uteri, unspecified: Secondary | ICD-10-CM | POA: Insufficient documentation

## 2011-09-02 DIAGNOSIS — Z01810 Encounter for preprocedural cardiovascular examination: Secondary | ICD-10-CM | POA: Insufficient documentation

## 2011-09-02 DIAGNOSIS — I10 Essential (primary) hypertension: Secondary | ICD-10-CM | POA: Insufficient documentation

## 2011-09-02 HISTORY — DX: Other specified postprocedural states: R11.2

## 2011-09-02 HISTORY — DX: Other specified postprocedural states: Z98.890

## 2011-09-02 LAB — DIFFERENTIAL
Basophils Absolute: 0 10*3/uL (ref 0.0–0.1)
Eosinophils Relative: 3 % (ref 0–5)
Lymphocytes Relative: 36 % (ref 12–46)
Lymphs Abs: 2.3 10*3/uL (ref 0.7–4.0)
Neutro Abs: 3.4 10*3/uL (ref 1.7–7.7)
Neutrophils Relative %: 55 % (ref 43–77)

## 2011-09-02 LAB — COMPREHENSIVE METABOLIC PANEL
ALT: 23 U/L (ref 0–35)
CO2: 27 mEq/L (ref 19–32)
Calcium: 9.6 mg/dL (ref 8.4–10.5)
Creatinine, Ser: 0.69 mg/dL (ref 0.50–1.10)
GFR calc Af Amer: >90 mL/min (ref >90)
GFR calc non Af Amer: 87 mL/min — ABNORMAL LOW (ref >90)
Glucose, Bld: 80 mg/dL (ref 70–99)
Sodium: 141 mEq/L (ref 135–145)
Total Protein: 7.1 g/dL (ref 6.0–8.3)

## 2011-09-02 LAB — SURGICAL PCR SCREEN: MRSA, PCR: NEGATIVE

## 2011-09-02 LAB — CBC
Hemoglobin: 14.6 g/dL (ref 12.0–15.0)
MCH: 30.2 pg (ref 26.0–34.0)
MCHC: 33.4 g/dL (ref 30.0–36.0)
MCV: 90.3 fL (ref 78.0–100.0)

## 2011-09-02 LAB — ABO/RH: ABO/RH(D): O NEG

## 2011-09-02 IMAGING — CR DG CHEST 2V
2 series · 2 of 2 positions shown · non-contrast
Comparison: 10/19/2007

CLINICAL DATA: Evaluation for hysterectomy.  Some congestion with
coughing hypertension treated medically.

CHEST - 2 VIEW

[w chest pa]
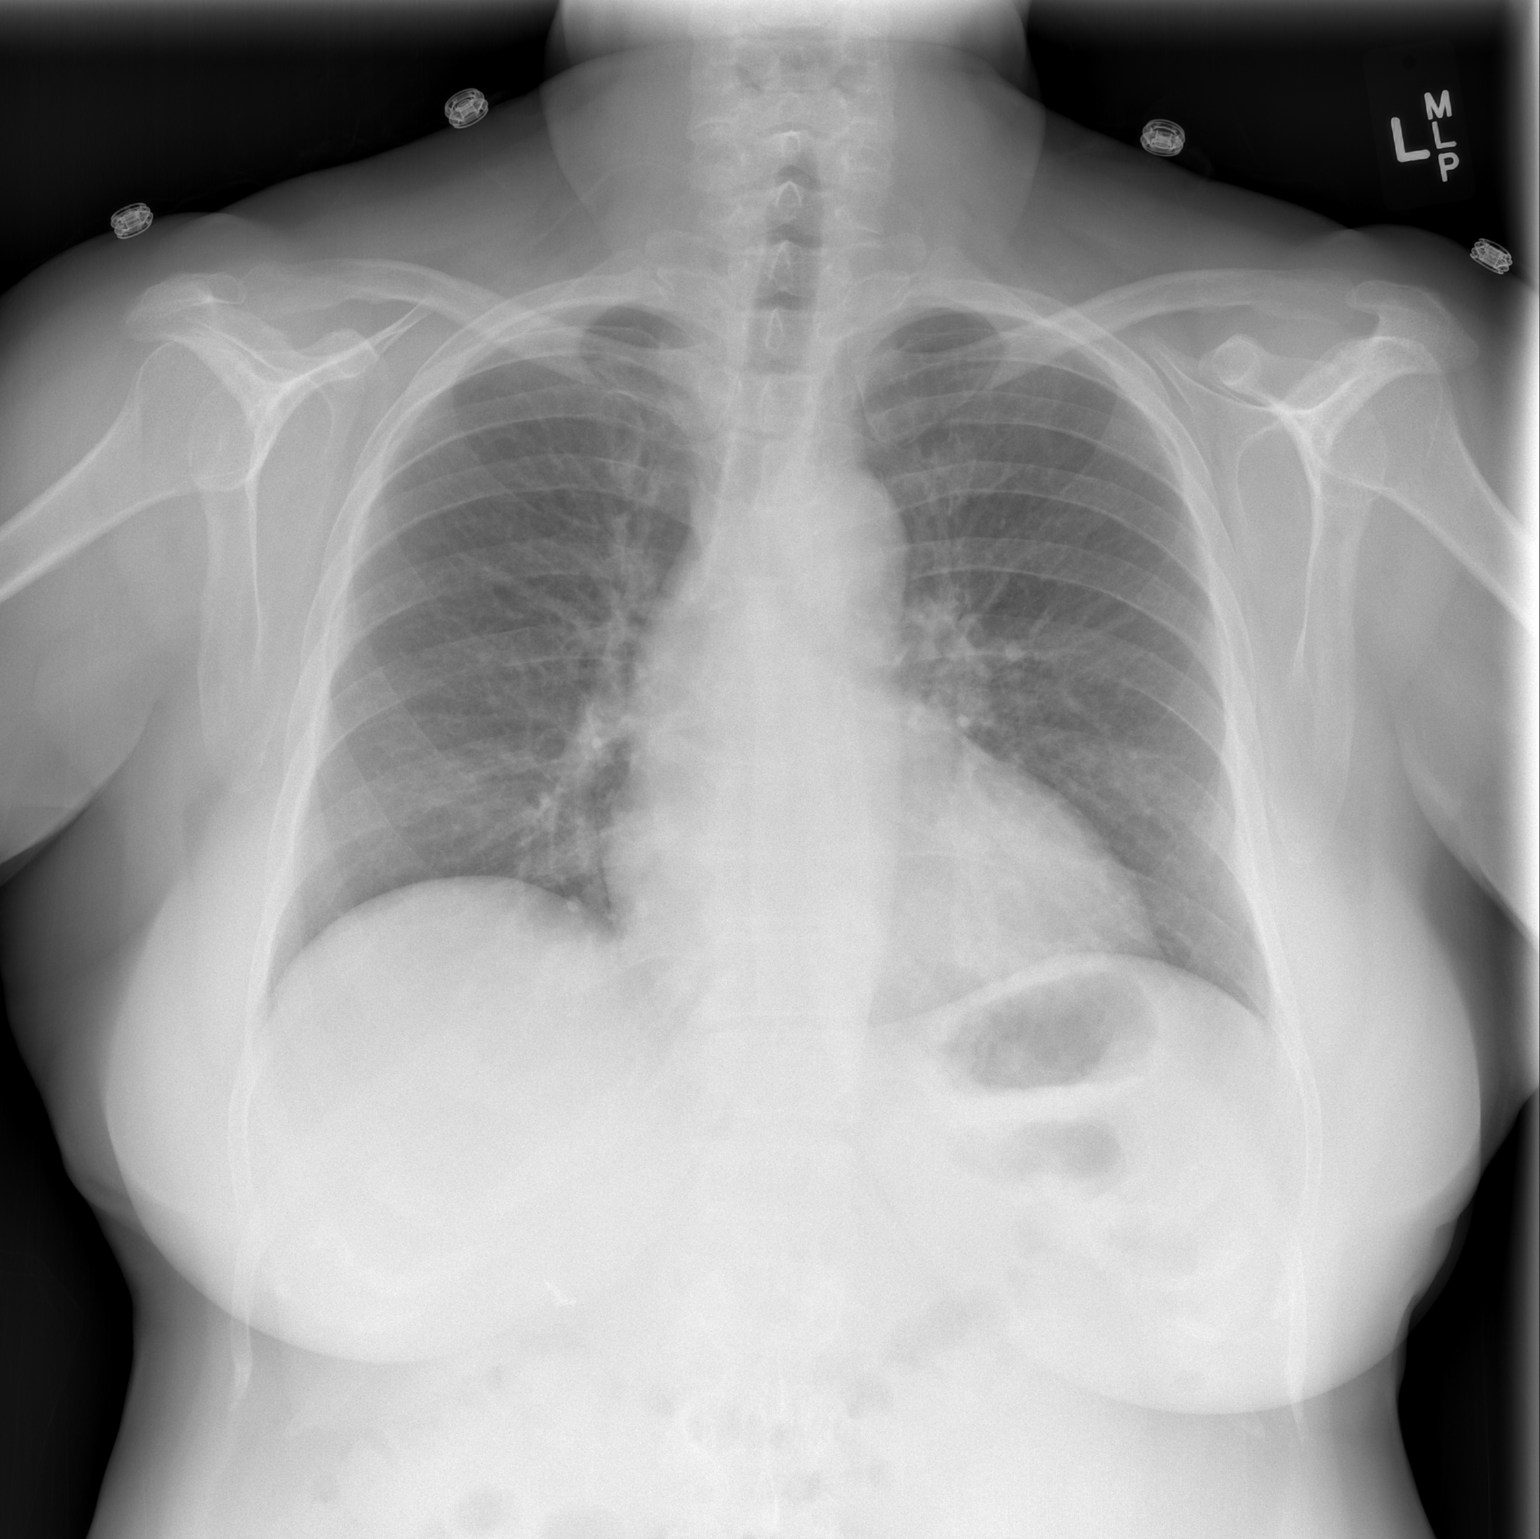

[w chest lat]
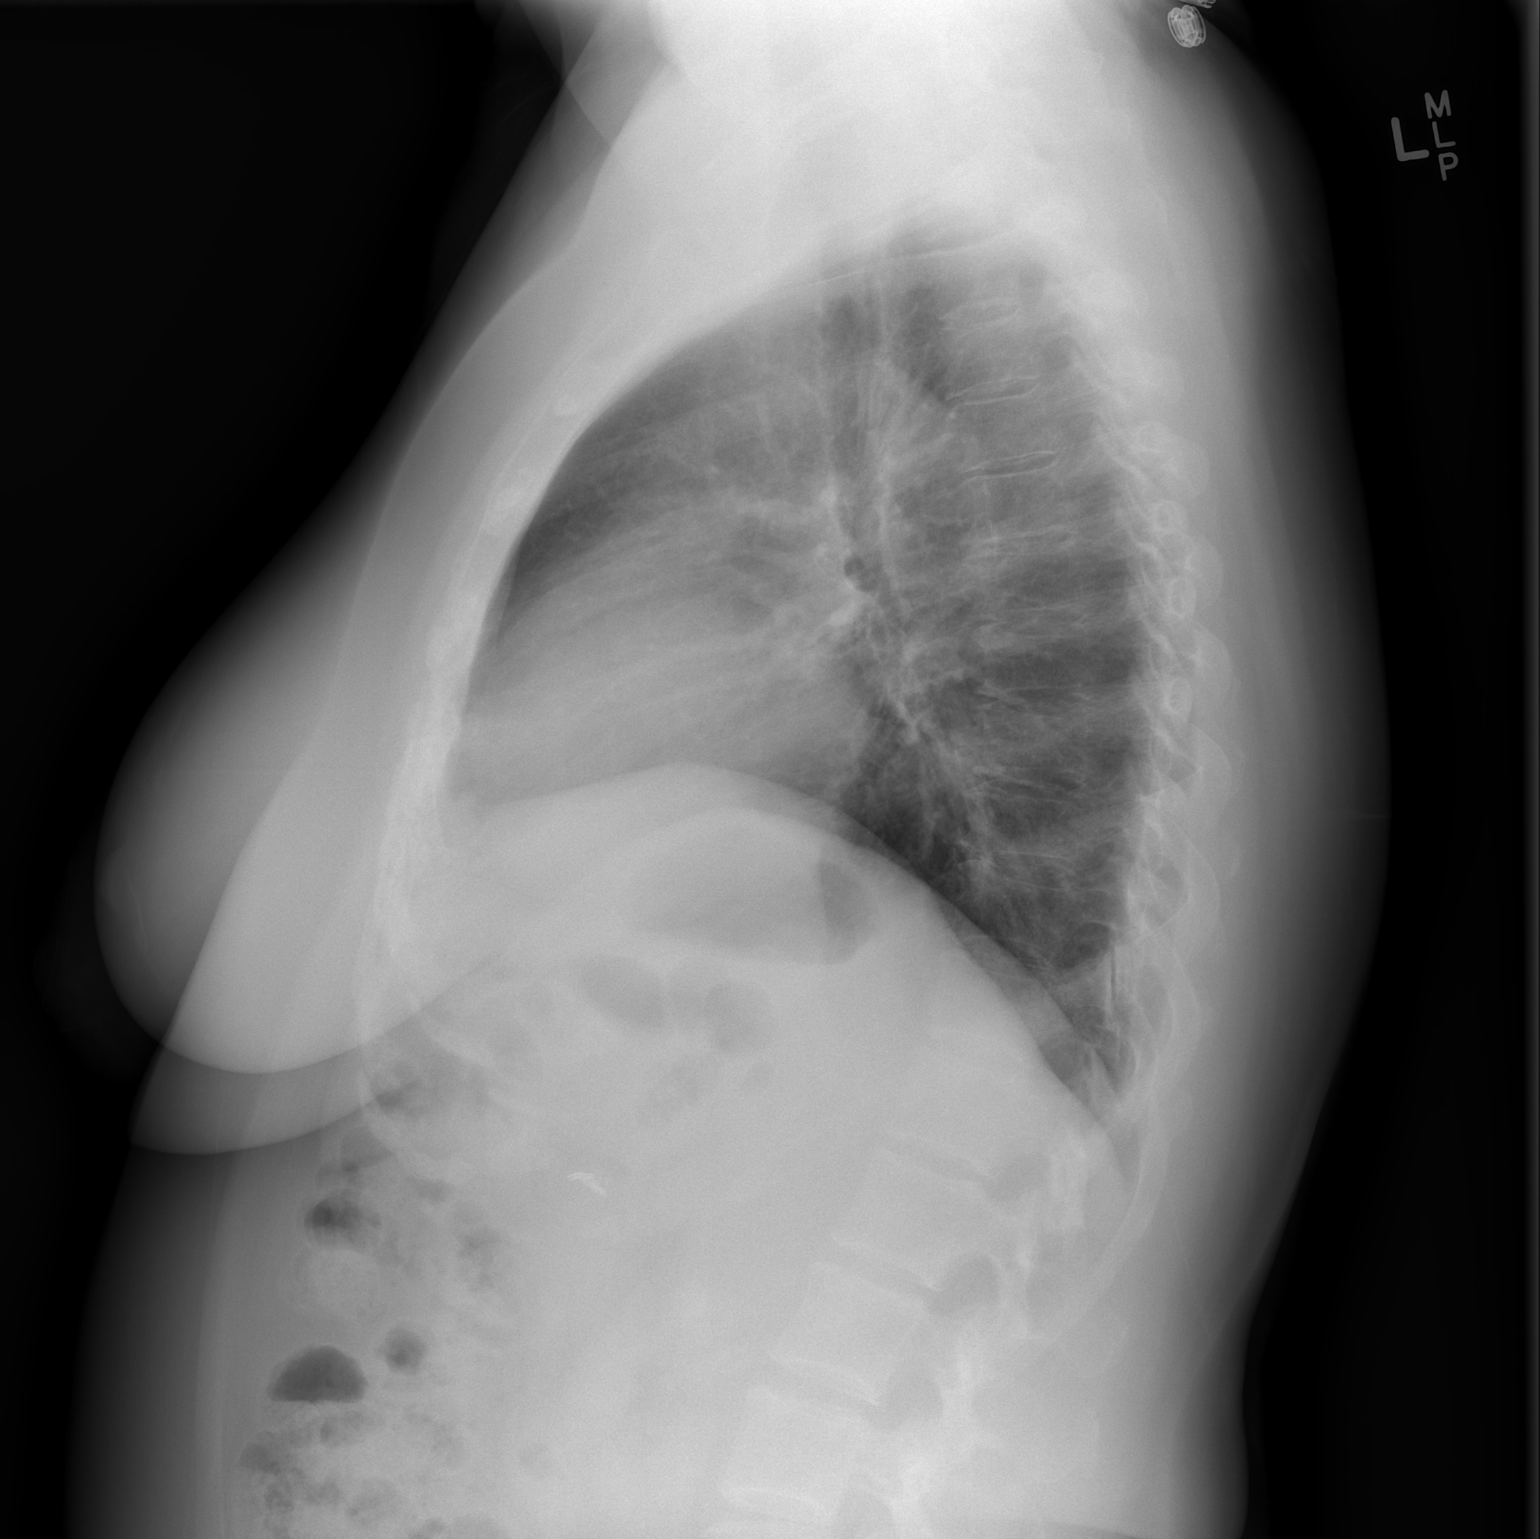

[2 of 2 positions shown; findings below may reference images not displayed]

FINDINGS: Low lung volumes are present and taking this into
consideration heart and mediastinal contours are stable and within
normal limits. Some crowding of bronchovascular markings at the
lung bases is noted due to the poor lung volumes.  The lung fields
otherwise appear clear with no signs of focal infiltrate or
congestive failure.  No pleural fluid or significant peribronchial
cuffing is seen.

Bony structures appear intact.
IMPRESSION: Stable cardiopulmonary appearance with no new focal or acute
abnormality seen.

## 2011-09-02 NOTE — Patient Instructions (Signed)
22 Glenda Powers  09/02/2011   Your procedure is scheduled on:  Tuesday 09/06/2011 at 0715 am  Report to [facility] at 0515 AM.  Call this number if you have problems the morning of surgery: [phone]   Remember: Follow clear liquid diet as instructed by Julien Nordmann   Do not eat food:After Midnight.  May have clear liquids:until Midnight .  Clear liquids include soda, tea, black coffee, apple or grape juice, broth.  Take these medicines the morning of surgery with A SIP OF WATER: None   Do not wear jewelry, make-up or nail polish.  Do not wear lotions, powders, or perfumes.   Do not shave 48 hours prior to surgery-women only-shaving legs  Do not bring valuables to the hospital.  Contacts, dentures or bridgework may not be worn into surgery.  Leave suitcase in the car. After surgery it may be brought to your room.  For patients admitted to the hospital, checkout time is 11:00 AM the day of discharge.       Special Instructions: CHG Shower Use Special Wash: 1/2 bottle night before surgery and 1/2 bottle morning of surgery.   Please read over the following fact sheets that you were given: MRSA Information, Sleep apnea sheet, Incentive Spirometry sheet

## 2011-09-05 MED ORDER — DEXTROSE 5 % IV SOLN
1.0000 g | INTRAVENOUS | Status: AC
  Administered 2011-09-06: 1 g via INTRAVENOUS
  Filled 2011-09-05 (×2): qty 1

## 2011-09-05 NOTE — Anesthesia Preprocedure Evaluation (Addendum)
Anesthesia Evaluation  Patient identified by MRN, date of birth, ID band Patient awake    Reviewed: Allergy & Precautions, H&P , NPO status , Patient's Chart, lab work & pertinent test results  History of Anesthesia Complications (+) PONV  Airway Mallampati: III TM Distance: >3 FB Neck ROM: full    Dental No notable dental hx. (+) Teeth Intact and Dental Advisory Given   Pulmonary neg pulmonary ROS,  breath sounds clear to auscultation  Pulmonary exam normal       Cardiovascular Exercise Tolerance: Good hypertension, Pt. on medications negative cardio ROS  Rhythm:regular Rate:Normal     Neuro/Psych negative neurological ROS  negative psych ROS   GI/Hepatic negative GI ROS, Neg liver ROS,   Endo/Other  negative endocrine ROS  Renal/GU negative Renal ROS  negative genitourinary   Musculoskeletal   Abdominal   Peds  Hematology negative hematology ROS (+)   Anesthesia Other Findings   Reproductive/Obstetrics negative OB ROS                          Anesthesia Physical Anesthesia Plan  ASA: II  Anesthesia Plan: General   Post-op Pain Management:    Induction: Intravenous  Airway Management Planned: Oral ETT  Additional Equipment:   Intra-op Plan:   Post-operative Plan: Extubation in OR  Informed Consent: I have reviewed the patients History and Physical, chart, labs and discussed the procedure including the risks, benefits and alternatives for the proposed anesthesia with the patient or authorized representative who has indicated his/her understanding and acceptance.   Dental Advisory Given  Plan Discussed with: CRNA and Surgeon  Anesthesia Plan Comments:         Anesthesia Quick Evaluation

## 2011-09-06 ENCOUNTER — Encounter (HOSPITAL_COMMUNITY)
Admission: RE | Disposition: A | Discharge status: Home / Self Care | Payer: Self-pay | Source: Ambulatory Visit | Attending: Obstetrics & Gynecology

## 2011-09-06 ENCOUNTER — Other Ambulatory Visit: Payer: Self-pay | Admitting: *Deleted

## 2011-09-06 ENCOUNTER — Encounter (HOSPITAL_COMMUNITY): Payer: Self-pay | Admitting: *Deleted

## 2011-09-06 ENCOUNTER — Encounter (HOSPITAL_COMMUNITY): Payer: Self-pay | Admitting: Anesthesiology

## 2011-09-06 ENCOUNTER — Ambulatory Visit (HOSPITAL_COMMUNITY): Payer: Medicare Other | Admitting: Anesthesiology

## 2011-09-06 ENCOUNTER — Inpatient Hospital Stay (HOSPITAL_COMMUNITY)
Admission: RE | Admit: 2011-09-06 | Discharge: 2011-09-07 | DRG: 741 | Disposition: A | Discharge status: Home / Self Care | Payer: Medicare Other | Source: Ambulatory Visit | Attending: Obstetrics & Gynecology | Admitting: Obstetrics & Gynecology

## 2011-09-06 DIAGNOSIS — C549 Malignant neoplasm of corpus uteri, unspecified: Principal | ICD-10-CM | POA: Diagnosis present

## 2011-09-06 DIAGNOSIS — C541 Malignant neoplasm of endometrium: Secondary | ICD-10-CM

## 2011-09-06 DIAGNOSIS — Z85820 Personal history of malignant melanoma of skin: Secondary | ICD-10-CM

## 2011-09-06 DIAGNOSIS — I10 Essential (primary) hypertension: Secondary | ICD-10-CM | POA: Diagnosis present

## 2011-09-06 HISTORY — PX: ABDOMINAL HYSTERECTOMY: SHX81

## 2011-09-06 LAB — CBC
HCT: 40.5 % (ref 36.0–46.0)
MCH: 29.9 pg (ref 26.0–34.0)
MCV: 89.6 fL (ref 78.0–100.0)
Platelets: 183 10*3/uL (ref 150–400)
RBC: 4.52 MIL/uL (ref 3.87–5.11)
WBC: 16.1 10*3/uL — ABNORMAL HIGH (ref 4.0–10.5)

## 2011-09-06 LAB — TYPE AND SCREEN
ABO/RH(D): O NEG
Antibody Screen: NEGATIVE

## 2011-09-06 LAB — CREATININE, SERUM: GFR calc Af Amer: >90 mL/min (ref >90)

## 2011-09-06 SURGERY — ROBOTIC ASSISTED TOTAL HYSTERECTOMY WITH BILATERAL SALPINGO OOPHORECTOMY
Anesthesia: General | Site: Abdomen | Wound class: Clean Contaminated

## 2011-09-06 MED ORDER — ONDANSETRON HCL 4 MG/2ML IJ SOLN
INTRAMUSCULAR | Status: DC | PRN
  Administered 2011-09-06: 4 mg via INTRAVENOUS

## 2011-09-06 MED ORDER — BUPIVACAINE HCL 0.25 % IJ SOLN
INTRAMUSCULAR | Status: DC | PRN
  Administered 2011-09-06: 10 mL

## 2011-09-06 MED ORDER — LACTATED RINGERS IR SOLN
Status: DC | PRN
  Administered 2011-09-06: 1000 mL

## 2011-09-06 MED ORDER — HEPARIN SODIUM (PORCINE) 1000 UNIT/ML IJ SOLN
INTRAMUSCULAR | Status: AC
  Filled 2011-09-06: qty 1

## 2011-09-06 MED ORDER — GLYCOPYRROLATE 0.2 MG/ML IJ SOLN
INTRAMUSCULAR | Status: DC | PRN
  Administered 2011-09-06: .5 mg via INTRAVENOUS

## 2011-09-06 MED ORDER — ATORVASTATIN CALCIUM 10 MG PO TABS
10.0000 mg | ORAL_TABLET | Freq: Every evening | ORAL | Status: DC
  Administered 2011-09-06: 10 mg via ORAL
  Filled 2011-09-06 (×2): qty 1

## 2011-09-06 MED ORDER — ENOXAPARIN SODIUM 40 MG/0.4ML ~~LOC~~ SOLN
SUBCUTANEOUS | Status: AC
  Administered 2011-09-06: 40 mg via SUBCUTANEOUS
  Filled 2011-09-06: qty 0.4

## 2011-09-06 MED ORDER — HYDRALAZINE HCL 20 MG/ML IJ SOLN
INTRAMUSCULAR | Status: DC | PRN
  Administered 2011-09-06: 5 mg via INTRAVENOUS

## 2011-09-06 MED ORDER — ACETAMINOPHEN 10 MG/ML IV SOLN
INTRAVENOUS | Status: DC | PRN
  Administered 2011-09-06: 1000 mg via INTRAVENOUS

## 2011-09-06 MED ORDER — DEXAMETHASONE SODIUM PHOSPHATE 10 MG/ML IJ SOLN
INTRAMUSCULAR | Status: DC | PRN
  Administered 2011-09-06: 10 mg via INTRAVENOUS

## 2011-09-06 MED ORDER — ACETAMINOPHEN 10 MG/ML IV SOLN
INTRAVENOUS | Status: AC
  Filled 2011-09-06: qty 100

## 2011-09-06 MED ORDER — BUPIVACAINE HCL (PF) 0.25 % IJ SOLN
INTRAMUSCULAR | Status: AC
  Filled 2011-09-06: qty 30

## 2011-09-06 MED ORDER — POLYVINYL ALCOHOL 1.4 % OP SOLN
1.0000 [drp] | Freq: Three times a day (TID) | OPHTHALMIC | Status: DC | PRN
  Filled 2011-09-06: qty 15

## 2011-09-06 MED ORDER — LACTATED RINGERS IV SOLN
INTRAVENOUS | Status: DC
  Administered 2011-09-06 (×3): via INTRAVENOUS

## 2011-09-06 MED ORDER — KCL IN DEXTROSE-NACL 20-5-0.45 MEQ/L-%-% IV SOLN
INTRAVENOUS | Status: DC
  Administered 2011-09-06: 1000 mL via INTRAVENOUS
  Administered 2011-09-06 – 2011-09-07 (×2): via INTRAVENOUS
  Filled 2011-09-06 (×4): qty 1000

## 2011-09-06 MED ORDER — MIDAZOLAM HCL 5 MG/5ML IJ SOLN
INTRAMUSCULAR | Status: DC | PRN
  Administered 2011-09-06: 2 mg via INTRAVENOUS

## 2011-09-06 MED ORDER — OXYCODONE-ACETAMINOPHEN 5-325 MG PO TABS
1.0000 | ORAL_TABLET | ORAL | Status: DC | PRN
  Administered 2011-09-06: 1 via ORAL
  Administered 2011-09-06 – 2011-09-07 (×3): 2 via ORAL
  Filled 2011-09-06: qty 2
  Filled 2011-09-06: qty 1
  Filled 2011-09-06 (×2): qty 2

## 2011-09-06 MED ORDER — HYDROMORPHONE HCL PF 1 MG/ML IJ SOLN
INTRAMUSCULAR | Status: AC
  Filled 2011-09-06: qty 1

## 2011-09-06 MED ORDER — ENOXAPARIN SODIUM 40 MG/0.4ML ~~LOC~~ SOLN
40.0000 mg | SUBCUTANEOUS | Status: AC
  Administered 2011-09-07: 40 mg via SUBCUTANEOUS
  Filled 2011-09-06: qty 0.4

## 2011-09-06 MED ORDER — ONDANSETRON 8 MG PO TBDP
8.0000 mg | ORAL_TABLET | Freq: Four times a day (QID) | ORAL | Status: DC | PRN
  Administered 2011-09-06 (×2): 8 mg via ORAL
  Filled 2011-09-06 (×2): qty 1

## 2011-09-06 MED ORDER — HYDROMORPHONE HCL PF 1 MG/ML IJ SOLN
1.0000 mg | INTRAMUSCULAR | Status: DC | PRN
  Administered 2011-09-06 (×2): 1 mg via INTRAVENOUS
  Filled 2011-09-06 (×2): qty 1

## 2011-09-06 MED ORDER — LISINOPRIL 10 MG PO TABS
10.0000 mg | ORAL_TABLET | Freq: Every day | ORAL | Status: DC
  Administered 2011-09-07: 10 mg via ORAL
  Filled 2011-09-06 (×2): qty 1

## 2011-09-06 MED ORDER — PSYLLIUM 0.52 G PO CAPS
2.0000 | ORAL_CAPSULE | Freq: Every day | ORAL | Status: DC
  Administered 2011-09-07: 1.04 g via ORAL
  Filled 2011-09-06 (×2): qty 2

## 2011-09-06 MED ORDER — LACTATED RINGERS IV SOLN: INTRAVENOUS | Status: DC

## 2011-09-06 MED ORDER — FENTANYL CITRATE 0.05 MG/ML IJ SOLN
INTRAMUSCULAR | Status: DC | PRN
Start: 2011-09-06 — End: 2011-09-06
  Administered 2011-09-06 (×3): 50 ug via INTRAVENOUS
  Administered 2011-09-06 (×2): 100 ug via INTRAVENOUS

## 2011-09-06 MED ORDER — HYDROMORPHONE HCL PF 1 MG/ML IJ SOLN
0.2500 mg | INTRAMUSCULAR | Status: DC | PRN
  Administered 2011-09-06 (×2): 0.5 mg via INTRAVENOUS

## 2011-09-06 MED ORDER — ROCURONIUM BROMIDE 100 MG/10ML IV SOLN
INTRAVENOUS | Status: DC | PRN
  Administered 2011-09-06: 50 mg via INTRAVENOUS
  Administered 2011-09-06: 20 mg via INTRAVENOUS

## 2011-09-06 MED ORDER — ENOXAPARIN SODIUM 40 MG/0.4ML ~~LOC~~ SOLN: 40.0000 mg | SUBCUTANEOUS | Status: DC

## 2011-09-06 MED ORDER — NEOSTIGMINE METHYLSULFATE 1 MG/ML IJ SOLN
INTRAMUSCULAR | Status: DC | PRN
  Administered 2011-09-06: 4 mg via INTRAVENOUS

## 2011-09-06 MED ORDER — PROPOFOL 10 MG/ML IV BOLUS
INTRAVENOUS | Status: DC | PRN
  Administered 2011-09-06: 200 mg via INTRAVENOUS

## 2011-09-06 SURGICAL SUPPLY — 50 items
BENZOIN TINCTURE PRP APPL 2/3 (GAUZE/BANDAGES/DRESSINGS) IMPLANT
CHLORAPREP W/TINT 26ML (MISCELLANEOUS) ×2 IMPLANT
CLOTH BEACON ORANGE TIMEOUT ST (SAFETY) ×2 IMPLANT
CORDS BIPOLAR (ELECTRODE) ×2 IMPLANT
COVER MAYO STAND STRL (DRAPES) ×2 IMPLANT
COVER SURGICAL LIGHT HANDLE (MISCELLANEOUS) ×2 IMPLANT
COVER TIP SHEARS 8 DVNC (MISCELLANEOUS) ×1 IMPLANT
COVER TIP SHEARS 8MM DA VINCI (MISCELLANEOUS) ×1
DECANTER SPIKE VIAL GLASS SM (MISCELLANEOUS) IMPLANT
DRAPE LG THREE QUARTER DISP (DRAPES) ×4 IMPLANT
DRAPE SURG IRRIG POUCH 19X23 (DRAPES) ×2 IMPLANT
DRAPE TABLE BACK 44X90 PK DISP (DRAPES) ×4 IMPLANT
DRAPE UTILITY 15X26 (DRAPE) ×2 IMPLANT
DRAPE UTILITY XL STRL (DRAPES) ×2 IMPLANT
DRAPE WARM FLUID 44X44 (DRAPE) ×2 IMPLANT
DRSG TEGADERM 6X8 (GAUZE/BANDAGES/DRESSINGS) ×4 IMPLANT
ELECT REM PT RETURN 9FT ADLT (ELECTROSURGICAL) ×2
ELECTRODE REM PT RTRN 9FT ADLT (ELECTROSURGICAL) ×1 IMPLANT
GAUZE VASELINE 3X9 (GAUZE/BANDAGES/DRESSINGS) IMPLANT
GLOVE BIO SURGEON STRL SZ 6.5 (GLOVE) ×8 IMPLANT
GLOVE BIO SURGEON STRL SZ7.5 (GLOVE) ×4 IMPLANT
GLOVE BIOGEL PI IND STRL 7.0 (GLOVE) ×2 IMPLANT
GLOVE BIOGEL PI INDICATOR 7.0 (GLOVE) ×2
GOWN PREVENTION PLUS XLARGE (GOWN DISPOSABLE) ×10 IMPLANT
HOLDER FOLEY CATH W/STRAP (MISCELLANEOUS) ×2 IMPLANT
KIT ACCESSORY DA VINCI DISP (KITS) ×1
KIT ACCESSORY DVNC DISP (KITS) ×1 IMPLANT
MANIPULATOR UTERINE 4.5 ZUMI (MISCELLANEOUS) ×2 IMPLANT
OCCLUDER COLPOPNEUMO (BALLOONS) ×2 IMPLANT
PACK LAPAROSCOPY W LONG (CUSTOM PROCEDURE TRAY) ×2 IMPLANT
POUCH SPECIMEN RETRIEVAL 10MM (ENDOMECHANICALS) ×6 IMPLANT
SET TUBE IRRIG SUCTION NO TIP (IRRIGATION / IRRIGATOR) ×2 IMPLANT
SHEET LAVH (DRAPES) ×2 IMPLANT
SOLUTION ELECTROLUBE (MISCELLANEOUS) ×2 IMPLANT
SPONGE LAP 18X18 X RAY DECT (DISPOSABLE) IMPLANT
STRIP CLOSURE SKIN 1/2X4 (GAUZE/BANDAGES/DRESSINGS) IMPLANT
SUT VIC AB 0 CT1 27 (SUTURE) ×3
SUT VIC AB 0 CT1 27XBRD ANTBC (SUTURE) ×3 IMPLANT
SUT VIC AB 4-0 PS2 27 (SUTURE) ×4 IMPLANT
SUT VICRYL 0 UR6 27IN ABS (SUTURE) ×4 IMPLANT
SYR 50ML LL SCALE MARK (SYRINGE) ×2 IMPLANT
SYR BULB IRRIGATION 50ML (SYRINGE) IMPLANT
TOWEL OR 17X26 10 PK STRL BLUE (TOWEL DISPOSABLE) ×4 IMPLANT
TRAP SPECIMEN MUCOUS 40CC (MISCELLANEOUS) IMPLANT
TRAY FOLEY CATH 14FRSI W/METER (CATHETERS) ×2 IMPLANT
TROCAR 12M 150ML BLUNT (TROCAR) ×2 IMPLANT
TROCAR BLADELESS OPT 5 75 (ENDOMECHANICALS) ×2 IMPLANT
TROCAR ENDOPATH XCEL 12X100 BL (ENDOMECHANICALS) ×2 IMPLANT
TUBING FILTER THERMOFLATOR (ELECTROSURGICAL) IMPLANT
WATER STERILE IRR 1500ML POUR (IV SOLUTION) ×4 IMPLANT

## 2011-09-06 NOTE — Interval H&P Note (Signed)
History and Physical Interval Note:  09/06/2011 7:03 AM  Glenda Powers  has presented today for surgery, with the diagnosis of endometrial cancer  The various methods of treatment have been discussed with the patient and family. After consideration of risks, benefits and other options for treatment, the patient has consented to  Procedure(s) (LRB): ROBOTIC ASSISTED TOTAL HYSTERECTOMY WITH BILATERAL SALPINGO OOPHERECTOMY  POSSIBLE LYMPH NODE DISSECTION (N/A) as a surgical intervention .  The patients' history has been reviewed, patient examined, no change in status, stable for surgery.  I have reviewed the patients' chart and labs.  Questions were answered to the patient's satisfaction.     [place], [facility]

## 2011-09-06 NOTE — Op Note (Signed)
Preoperative Diagnosis: Grade 1 endometrial cancer  Postoperative Diagnosis: Stage IA grade 1 endometrial cancer  Procedure(s) Performed: Robotic total laparoscopic hysterectomy, Bilateral salpingo oophorectomy,  right pelvic lymph node dissection  Anesthesia: Gen. endotracheal  Surgeon: Maryclare Labrador.  Nelly Rout, M.D. PhD  Assistant Surgeon: Coral Ceo MD.  Assistant: Telford Nab RN, MSN  Specimens: Uterus cervix, right lateral ovaries tubes and right pelvic lymph nodes  Estimated Blood Loss: 100 mL.    Complications: None  Indication for Procedure: This is a 71 year old with endometrial biopsy demonstrating grade 1 endometrial cancer. She was counseled regarding therapeutic options and opted for a robotic endometrial cancer staging. Risks and benefits of the procedure were discussed with the patient who consented  Operative Findings:  8 cm uterus anterior lower uterine segment fibroid. Normal appearing adnexa. No masses.   Frozen pathology was consistent with grade 1 endometrial adenocarcinoma no cervical involvement involving less than 50% of the myometrium  Procedure: Patient was taken to the operating room and placed under general endotracheal anesthesia without any difficulty. She is placed in the dorsal lithotomy position.  The patient was taped to the operating bed in the area of the upper abdomen with care taken to protect the nipples with sponges The patient was prepped and draped and the uterine manipulator placed within the endometrial cavity. The appropriately sized Koh ring was circumferentially around the cervix. The balloon was placed within the vagina. An OG tube was present and functional. At an area on the left in line with the nipple approximately 2 cm below the ribs the area was infiltrated with 1% lidocaine and a 5 mm Optiview inserted under direct visualization. The abdomen was insufflated to 15 mm of mercury and the pressure never deviated about that throughout the  remainder of the procedure. Maximum Trendelenburg positioning was obtained but could not be maintained given challenges with intubation. The decline of the Trendelenburg position was decreased to optimize her respiratory function during the case.   At approximately 25cm proximal to the symphysis pubis an incision was made just superior to the umbilicus. This area was infiltrated with lidocaine as well as the location 10 cm lateral to this incision and 2 cm superior to the left anterior superior iliac spine. Incisions were made. 10 mm trocar was inserted in the superior umbilicus incision. Millimeter robotic ports were placed in the other 3 incisions. The left upper quadrant port site was replaced with a 10 mm port. This was all completed under direct visualization. The small and large bowel were reflected as much as possible into the upper abdomen. The robot was docked and instruments placed.  The right round ligament was transected and the ureter was identified. The right infundibulopelvic ligament was cauterized and transected The retroperitoneal space was entered on the right and the peritoneum incised to the level of the vesicouterine ligament anteriorly. The bladder flap was created using Bovie cautery. The peritoneal dissection was continued inferiorly and across the inferior most aspect of the cervix. In this manner the urethra was deflected inferiorly. The bladder flap was further developed. The uterine vessels on the right were skeletonized ligated and transected.  The rectosigmoid was noted to be adherent to the pelvic sidewall. These filmy adhesions were transected. The ureter was identified. The left gonadal vessels were cauterized and transected. The broad ligament was skeletonized posteriorly to the level of the cervix and the peritoneum dissected free from the cervix and in this fashion the ureter was deflected inferiorly. The anterior peritoneum was  further dissected and the bladder flap  appropriately developed. The uterine vessels were skeletonized cauterized and transected. The balloon and the vagina was then maximally insufflated. A colpotomy incision was made circumferentially and the uterus was delivered from the vagina. The Koh ring was removed and the balloon was replaced.  Right pelvic lymph node dissection was then initiated. The superior vesicle artery was identified and the vesicouterine space developed. The obturator nerve was identified. Nodal tissue was removed within the boundaries of the right genitofemoral nerve the right circumflex vein, the ureter and the superior vesicle artery.   At that time the frozen section evaluation returned and it was a grade 1 endometrial cancer it was superficially invasion without any cervical involvement.  The nodal tissue was placed in an Endo Catch bag. The specimens were removed through the vagina. The vaginal balloon was reinserted.  The pelvis was copiously irrigated and drained and hemostasis was assured. The vaginal cuff was closed with a running 0 Vicryl suture ligature. The needle was removed under direct visualization. The operative site is once again visualized and hemostasis was assured. The instruments were removed from the abdomen and pelvis and the port sites irrigated. The umbilical fascia was closed with an interrupted 0. Vicryl  suture. The subcutaneous tissue of the umbilical left upper quadrant and right lower quadrant subcutaneous tissues were approximated with a single suture. Skin incisions were closed with a subcuticular suture.  Dermabond was placed over the incision sites.  The vaginal vault was cleared with a moist sponge stick.  Sponge, lap and needle counts were correct x 3.    The patient had sequential compression devices and preoperative Lovenox for VTE prophylaxis and will receive Lovenox postoperatively.           Disposition: PACU - hemodynamically stable.         Condition:Stable. Foley draining  clear urine.

## 2011-09-06 NOTE — Preoperative (Signed)
Beta Blockers   Reason not to administer Beta Blockers:Not Applicable 

## 2011-09-06 NOTE — Anesthesia Postprocedure Evaluation (Signed)
  Anesthesia Post-op Note  Patient: Glenda Powers  Procedure(s) Performed: Procedure(s) (LRB): ROBOTIC ASSISTED TOTAL HYSTERECTOMY WITH BILATERAL SALPINGO OOPHERECTOMY (N/A)  Patient Location: PACU  Anesthesia Type: General  Level of Consciousness: awake and alert   Airway and Oxygen Therapy: Patient Spontanous Breathing  Post-op Pain: mild  Post-op Assessment: Post-op Vital signs reviewed, Patient's Cardiovascular Status Stable, Respiratory Function Stable, Patent Airway and No signs of Nausea or vomiting  Post-op Vital Signs: stable  Complications: No apparent anesthesia complications

## 2011-09-06 NOTE — Transfer of Care (Signed)
Immediate Anesthesia Transfer of Care Note  Patient: Glenda Powers  Procedure(s) Performed: Procedure(s) (LRB): ROBOTIC ASSISTED TOTAL HYSTERECTOMY WITH BILATERAL SALPINGO OOPHERECTOMY (N/A)  Patient Location: PACU  Anesthesia Type: General  Level of Consciousness: awake and patient cooperative  Airway & Oxygen Therapy: Patient Spontanous Breathing and Patient connected to face mask oxygen  Post-op Assessment: Report given to PACU RN and Post -op Vital signs reviewed and stable  Post vital signs: Reviewed and stable  Complications: No apparent anesthesia complications

## 2011-09-06 NOTE — H&P (View-Only) (Signed)
H&P   Glenda Powers 71 y.o. female  Chief Complaint  Patient presents with  . Endometrial cancer    New consult    History of Present Illness:  Seen in consultation at the request of Dr. Ileana Roup regarding the management of a newly diagnosed endometrial cancer. Developed postmenopausal bleeding and underwent an endometrial biopsy.  Path showed Grade 1 endometrial cancer. No significant gyn history.  Has a recent history of melanoma of the scalp (currently NED) Denies any GI or GU symptoms or weight loss or pain.  Past Medical History  Diagnosis Date  . Hypertension   . Endometrioid carcinoma    Past Surgical History  Procedure Date  . Melanoma excision 2012    from top of head  . Cholecystectomy     18 years ago  . Tonsillectomy     as a child   Current Outpatient Prescriptions  Medication Sig Dispense Refill  . atorvastatin (LIPITOR) 10 MG tablet Take 10 mg by mouth daily.      Marland Kitchen lisinopril (PRINIVIL,ZESTRIL) 10 MG tablet Take 10 mg by mouth daily.      . psyllium (REGULOID) 0.52 G capsule Take 2 capsules by mouth daily.       No Known Allergies History   Social History  . Marital Status: Married    Spouse Name: N/A    Number of Children: N/A  . Years of Education: N/A   Occupational History  . Not on file.   Social History Main Topics  . Smoking status: Never Smoker   . Smokeless tobacco: Not on file  . Alcohol Use: Yes     occassional  . Drug Use: No  . Sexually Active: Yes   Other Topics Concern  . Not on file   Social History Narrative  . No narrative on file   Family History  Problem Relation Age of Onset  . Cancer Maternal Aunt     Pertinent Gynecological History: negative ROS: 10 point review is negative except as noted above. Vitals:  Blood pressure 170/86, pulse 78, temperature 98.1 F (36.7 C), temperature source Oral, resp. rate 16, height 5' 1.77" (1.569 m), weight 172 lb 9.6 oz (78.291 kg).  Physical Exam: In general the patient  is a healthy WF in no distress HEENT: negative Neck: supple  No thryomegally Nodes: supraclavicular and inguinal normal Abdomen: soft, nontender, no masses, no organomegally or ascites. Pelvic: EGBUS: normal Vagina: normal no lesions or blood Cervix: normal Uterus:  Normal shape, size and consistency Adenxa without masses or tenderness RV: confirms Extremities: normal. 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  Discussed natural history  And treatment with patient, husband and friend.  I have recommended a minimally invasive hysterectomy, BSO and possible lymphadenectomy depending on depth of invasion.  Minimally invasive surgery is her preference and we will arrange for either Dr. Duard Brady or Nelly Rout to perform the procedure.  The patient and her husband are in agreement with this plan. Risks of surgery discussed including hemorrhage, infection, transfusion, injury to adjacent viscrea, thromboembolic complications and anesthetic risks.  All questions are answered.   Jeannette Corpus, MD 08/16/2011, 3:19 PM

## 2011-09-07 LAB — BASIC METABOLIC PANEL
CO2: 26 mEq/L (ref 19–32)
Chloride: 98 mEq/L (ref 96–112)
Creatinine, Ser: 0.95 mg/dL (ref 0.50–1.10)
Sodium: 132 mEq/L — ABNORMAL LOW (ref 135–145)

## 2011-09-07 LAB — CBC
HCT: 33.7 % — ABNORMAL LOW (ref 36.0–46.0)
MCV: 89.4 fL (ref 78.0–100.0)
RBC: 3.77 MIL/uL — ABNORMAL LOW (ref 3.87–5.11)
WBC: 17.5 10*3/uL — ABNORMAL HIGH (ref 4.0–10.5)

## 2011-09-07 MED ORDER — OXYCODONE-ACETAMINOPHEN 5-325 MG PO TABS: 1.0000 | ORAL_TABLET | ORAL | Status: AC | PRN

## 2011-09-07 MED ORDER — ONDANSETRON HCL 4 MG/2ML IJ SOLN
4.0000 mg | Freq: Four times a day (QID) | INTRAMUSCULAR | Status: DC | PRN
  Administered 2011-09-07: 4 mg via INTRAVENOUS
  Filled 2011-09-07: qty 2

## 2011-09-07 MED ORDER — SIMETHICONE 80 MG PO CHEW
80.0000 mg | CHEWABLE_TABLET | Freq: Four times a day (QID) | ORAL | Status: DC | PRN
  Administered 2011-09-07 (×2): 80 mg via ORAL
  Filled 2011-09-07: qty 1

## 2011-09-07 NOTE — Discharge Instructions (Signed)
09/07/2011  Return to work: 3-4 weeks  Activity: 1. Be up and out of the bed during the day.  Take a nap if needed.  You may walk up steps but be careful and use the hand rail.  Stair climbing will tire you more than you think, you may need to stop part way and rest.   2. No lifting or straining for 6 weeks.  3. Do Not drive if you are taking narcotic pain medicine.  4. Shower daily.  Use soap and water on your incision and pat dry; don't rub.   5. No sexual activity and nothing in the vagina for 8 weeks.  Diet: 1. Low sodium Heart Healthy Diet is recommended.  2. It is safe to use a laxative if you have difficulty moving your bowels.   Wound Care: 1. Keep clean and dry.  Shower daily.  Reasons to call the Doctor:   Fever - Oral temperature greater than 100.4 degrees Fahrenheit  Foul-smelling vaginal discharge  Difficulty urinating  Nausea and vomiting  Increased pain at the site of the incision that is unrelieved with pain medicine.  Difficulty breathing with or without chest pain  New calf pain especially if only on one side  Sudden, continuing increased vaginal bleeding with or without clots.   Contacts: For questions or concerns you should contact:  Dr. Antionette Char at [phone]  Dr. Nelly Rout at [facility] [phone]   Hysterectomy Care After Refer to this sheet in the next few weeks. These instructions provide you with information on caring for yourself after your procedure. Your caregiver may also give you more specific instructions. Your treatment has been planned according to current medical practices, but problems sometimes occur. Call your caregiver if you have any problems or questions after your procedure. HOME CARE INSTRUCTIONS  Healing will take time. You may have discomfort, tenderness, swelling, and bruising at the surgical site for about 2 weeks. This is normal and will get better as time goes on.  Only take over-the-counter or prescription  medicines for pain, discomfort, or fever as directed by your caregiver.   Do not take aspirin. It can cause bleeding.   Do not drive when taking pain medicine.   Follow your caregiver's advice regarding exercise, lifting, driving, and general activities.   Resume your usual diet as directed and allowed.   Get plenty of rest and sleep.   Do not douche, use tampons, or have sexual intercourse for at least 8 weeks or until your caregiver gives you permission.   Change your bandages (dressings) as directed by your caregiver.   Monitor your temperature.   Take showers instead of baths for 2 to 3 weeks.   Do not drink alcohol until your caregiver gives you permission.   If you are constipated, you may take a mild laxative with your caregiver's permission. Bran foods may help with constipation problems. Drinking enough fluids to keep your urine clear or pale yellow may help as well.   Try to have someone home with you for 1 or 2 weeks to help around the house.   Keep all of your follow-up appointments as directed by your caregiver.  SEEK MEDICAL CARE IF:   You have swelling, redness, or increasing pain in the surgical cut (incision) area.   You have pus coming from the incision.   You notice a bad smell coming from the incision or dressing.   You have swelling, redness, or pain around the intravenous (IV) site.   Your  incision breaks open.   You feel dizzy or lightheaded.   You have pain or bleeding when you urinate.   You have persistent diarrhea.   You have persistent nausea and vomiting.   You have abnormal vaginal discharge.   You have a rash.   You have any type of abnormal reaction or develop an allergy to your medicine.   Your pain is not controlled with your prescribed medicine.  SEEK IMMEDIATE MEDICAL CARE IF:   You have a fever.   You have severe abdominal pain.   You have chest pain.   You have shortness of breath.   You faint.   You have pain,  swelling, or redness of your leg.   You have heavy vaginal bleeding with blood clots.  MAKE SURE YOU:  Understand these instructions.   Will watch your condition.   Will get help right away if you are not doing well or get worse.  Document Released: 11/05/2004 Document Revised: 04/07/2011 Document Reviewed: 12/03/2010 [facility] Patient Information 2012 [place], [place].

## 2011-09-07 NOTE — Discharge Summary (Signed)
Physician Discharge Summary  Patient ID: Glenda Powers MRN: [id number] DOB/AGE: 1940/12/09 71 y.o.  Admit date: 09/06/2011 Discharge date: 09/07/2011  Admission Diagnoses: Endometrial cancer  Discharge Diagnoses:  Principal Problem:  *Endometrial cancer   Discharged Condition: stable  Hospital Course: On 09/06/2011, the patient underwent the following: Procedure(s): ROBOTIC ASSISTED TOTAL HYSTERECTOMY WITH BILATERAL SALPINGO OOPHERECTOMY.   The postoperative course was uneventful.  She was discharged to home on postoperative day 1 tolerating a regular diet.  Consults: None  Significant Diagnostic Studies: None  Treatments: surgery: See Above  Discharge Exam: Blood pressure 118/69, pulse 60, temperature 98.4 F (36.9 C), temperature source Oral, resp. rate 18, height 5\' 2"  (1.575 m), weight 169 lb 15.6 oz (77.1 kg), SpO2 98.00%. General appearance: alert and cooperative GI: soft, non-tender; bowel sounds normal; no masses,  no organomegaly Extremities: extremities normal, atraumatic, no cyanosis or edema Incision/Wound: C/D/I  Disposition: 01-Home or Self Care  Discharge Orders    Future Orders Please Complete By Expires   Diet - low sodium heart healthy      Increase activity slowly      May shower / Bathe      Driving Restrictions      Comments:   Do not drive while taking narcotics.   Lifting restrictions      Comments:   No lifting greater than 30 lbs   Sexual Activity Restrictions      Comments:   No sexual activity for 8 weeks.   Call MD for:  temperature >100.4      Call MD for:  persistant nausea and vomiting      Call MD for:  severe uncontrolled pain      Call MD for:  redness, tenderness, or signs of infection (pain, swelling, redness, odor or green/yellow discharge around incision site)      Call MD for:  difficulty breathing, headache or visual disturbances      Call MD for:  hives      Call MD for:  persistant dizziness or light-headedness      Call MD  for:  extreme fatigue        Medication List  As of 09/07/2011  8:04 AM   TAKE these medications         atorvastatin 10 MG tablet   Commonly known as: LIPITOR   Take 10 mg by mouth every evening.      econazole nitrate 1 % cream   Apply 1 application topically 2 (two) times daily.      ibuprofen 200 MG tablet   Commonly known as: ADVIL,MOTRIN   Take 200 mg by mouth every 6 (six) hours as needed.      lisinopril 10 MG tablet   Commonly known as: PRINIVIL,ZESTRIL   Take 10 mg by mouth daily with breakfast.      oxyCODONE-acetaminophen 5-325 MG per tablet   Commonly known as: PERCOCET   Take 1-2 tablets by mouth every 4 (four) hours as needed for pain.      psyllium 0.52 G capsule   Commonly known as: REGULOID   Take 2 capsules by mouth daily with breakfast.      RETAINE CMC 0.5 % Soln   Generic drug: carboxymethylcellulose   Place 1 drop into both eyes 3 (three) times daily as needed. For dry eyes           Follow-up Information    Please follow up. (Call Telford Nab [phone])  Signed: Ifeoluwa Beller DEAL 09/07/2011, 8:04 AM

## 2011-10-18 ENCOUNTER — Encounter: Payer: Self-pay | Admitting: Gynecologic Oncology

## 2011-10-18 ENCOUNTER — Ambulatory Visit: Payer: Medicare Other | Attending: Gynecologic Oncology | Admitting: Gynecologic Oncology

## 2011-10-18 VITALS — BP 150/72 | HR 74 | Temp 97.9°F | Resp 16 | Ht 61.77 in | Wt 166.5 lb

## 2011-10-18 DIAGNOSIS — Z9079 Acquired absence of other genital organ(s): Secondary | ICD-10-CM | POA: Insufficient documentation

## 2011-10-18 DIAGNOSIS — Z90710 Acquired absence of both cervix and uterus: Secondary | ICD-10-CM | POA: Insufficient documentation

## 2011-10-18 DIAGNOSIS — C549 Malignant neoplasm of corpus uteri, unspecified: Secondary | ICD-10-CM | POA: Insufficient documentation

## 2011-10-18 DIAGNOSIS — C541 Malignant neoplasm of endometrium: Secondary | ICD-10-CM

## 2011-10-18 NOTE — Patient Instructions (Signed)
Return to clinic in 6 months.  Follow up with Dr. Greta Doom in 12 months at which time a Pap test should be collected.

## 2011-10-18 NOTE — Progress Notes (Signed)
Office Visit:  Gyn Onc  HPI:  Glenda Powers is a 71 y.o. year old No obstetric history on file. initially seen in consultation on 08/16/2011 for FIGO grade 1 endometrial cancer.  She then underwent a robotic total laparoscopic hysterectomy bilateral salpingo-oophorectomy right lymph node dissection on 09/06/2011 without complications.  Her postoperative course was uncomplicated.  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  She is seen today for a postoperative check and to discuss her pathology results and treatment plan.  Since discharge from the hospital, she is feeling well.  She has improving appetite, normal bowel and bladder function.  She reports numbness of the inner right thigh and vaginal spotting yesterday.  She has no other complaints today.    Review of systems: Constitutional:   She has no fever or chills. Eyes: No blurred vision Ears, Nose, Mouth, Throat: No dizziness, headaches or changes in hearing. No mouth sores. Cardiovascular: No chest pain, palpitations or edema. Respiratory:  No shortness of breath, wheezing or cough Gastrointestinal: She has normal bowel movements without diarrhea or constipation. She denies any nausea or vomiting.  Genitourinary:  She denies pelvic pain, pelvic pressure or changes in her urinary function. She has no hematuria, dysuria, or incontinence. She has some recent vaginal bleeding, but otherwise denies vaginal discharge Musculoskeletal: Denies muscle weakness or joint pains.  Neurological: Reports numbness of the inner right thigh    Physical Exam: Blood pressure 150/72, pulse 74, temperature 97.9 F (36.6 C), temperature source Oral, resp. rate 16, height 5' 1.77" (1.569 m), weight 166 lb 8 oz (75.524 kg). General: Well dressed, well nourished in no apparent distress.   HEENT:  Normocephalic and  atraumatic, no lesions.   Skin:  No lesions or rashes. Lungs:  Clear to auscultation bilaterally.  No wheezes. Cardiovascular:  Regular rate and rhythm.  No murmurs or rubs. Abdomen:  Soft, nontender, nondistended.  No palpable masses.  No hepatosplenomegaly.  No ascites. Normal bowel sounds.  No hernias. Robotic port sites are nontender without any erythema Genitourinary: Normal EGBUS  Vaginal cuff intact.  Abrasion noted in the midportion of the vagina. Silver nitrate was applied.   Extremities: No cyanosis, clubbing or edema.  No calf tenderness or erythema. No palpable cords.  Assessment:    71 y.o. year old with Stage 1A Grade 1 endometrioid endometrial cancer.   S/p robotic laparoscopic hysterectomy bilateral salpingo-oophorectomy right pelvic lymph node dissection on 09/06/1998. Absent LVSI, point % myometrial invasion, and negative lymph nodes.   Plan: 1) Pathology reports reviewed today 2) Treatment counseling - Very low risk for recurrence given age, grade, depth of myometrial invasion and LVSI status.  We will start with visits every 6 months x 2 years, then every 12 months for 3 more years.  Discussed signs and symptoms of recurrence including vaginal bleeding or discharge, leg pain or swelling and changes in bowel or bladder habits. She was given the opportunity to ask questions, which were answered to her satisfaction, and she is agreement with the above mentioned plan of care. 3)  Return to clinic in 6 months.  Follow up with Dr. Greta Doom in 12 months at which time a Pap test should be collected.

## 2012-04-17 ENCOUNTER — Ambulatory Visit: Payer: Medicare Other | Attending: Gynecologic Oncology | Admitting: Gynecologic Oncology

## 2012-04-17 ENCOUNTER — Encounter: Payer: Self-pay | Admitting: Gynecologic Oncology

## 2012-04-17 ENCOUNTER — Ambulatory Visit (HOSPITAL_COMMUNITY)
Admission: RE | Admit: 2012-04-17 | Discharge: 2012-04-17 | Disposition: A | Discharge status: Home / Self Care | Payer: Medicare Other | Source: Ambulatory Visit | Attending: Gynecologic Oncology | Admitting: Gynecologic Oncology

## 2012-04-17 ENCOUNTER — Telehealth: Payer: Self-pay | Admitting: Gynecologic Oncology

## 2012-04-17 VITALS — BP 148/76 | HR 84 | Temp 98.4°F | Resp 20 | Ht 61.77 in

## 2012-04-17 DIAGNOSIS — R05 Cough: Secondary | ICD-10-CM

## 2012-04-17 DIAGNOSIS — R059 Cough, unspecified: Secondary | ICD-10-CM | POA: Insufficient documentation

## 2012-04-17 DIAGNOSIS — C549 Malignant neoplasm of corpus uteri, unspecified: Secondary | ICD-10-CM | POA: Insufficient documentation

## 2012-04-17 DIAGNOSIS — C541 Malignant neoplasm of endometrium: Secondary | ICD-10-CM

## 2012-04-17 IMAGING — CR DG CHEST 2V
2 series · 2 of 2 positions shown · non-contrast
Comparison: [DATE]

CLINICAL DATA: Cough; history of endometrial carcinoma

CHEST - 2 VIEW

[w chest pa]
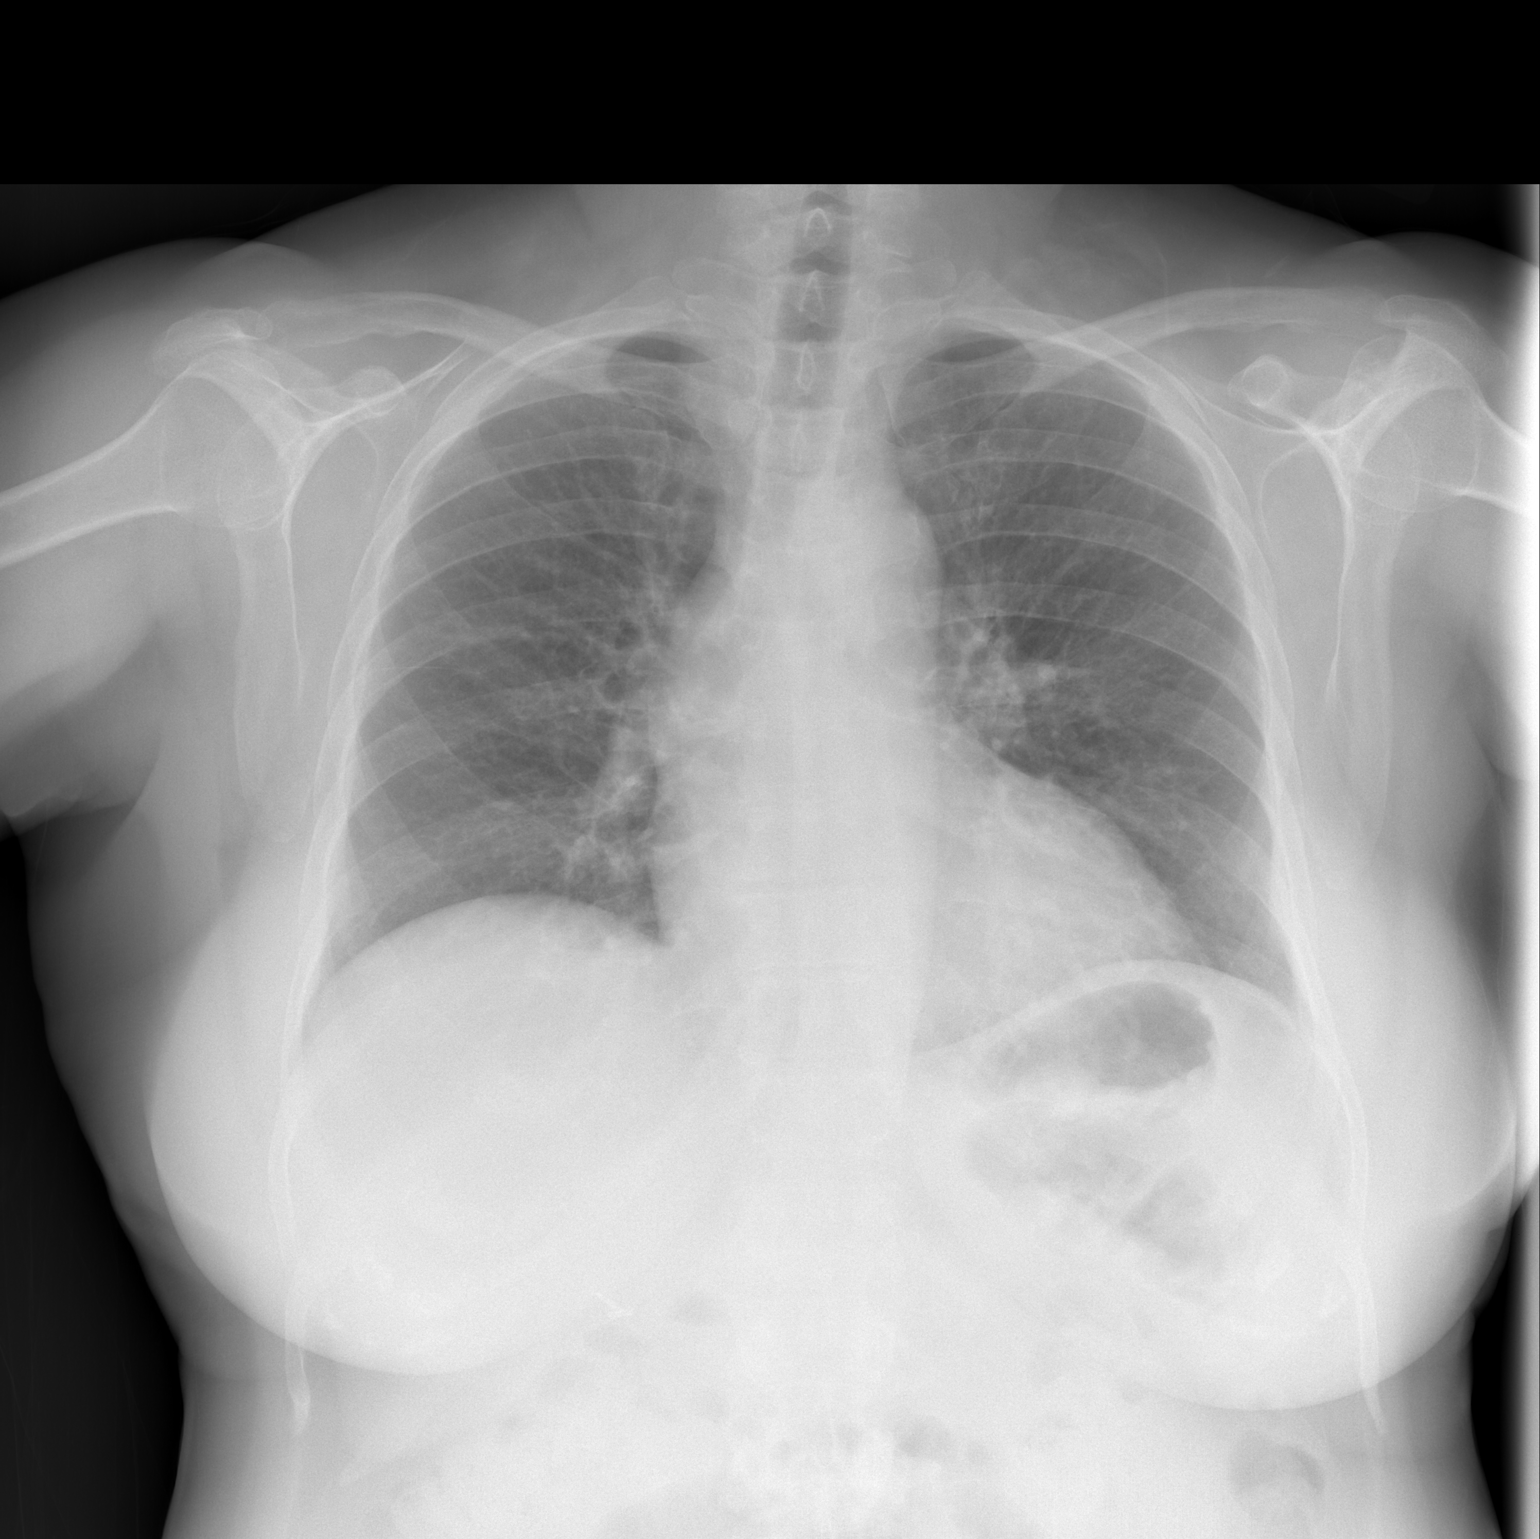

[w chest lat]
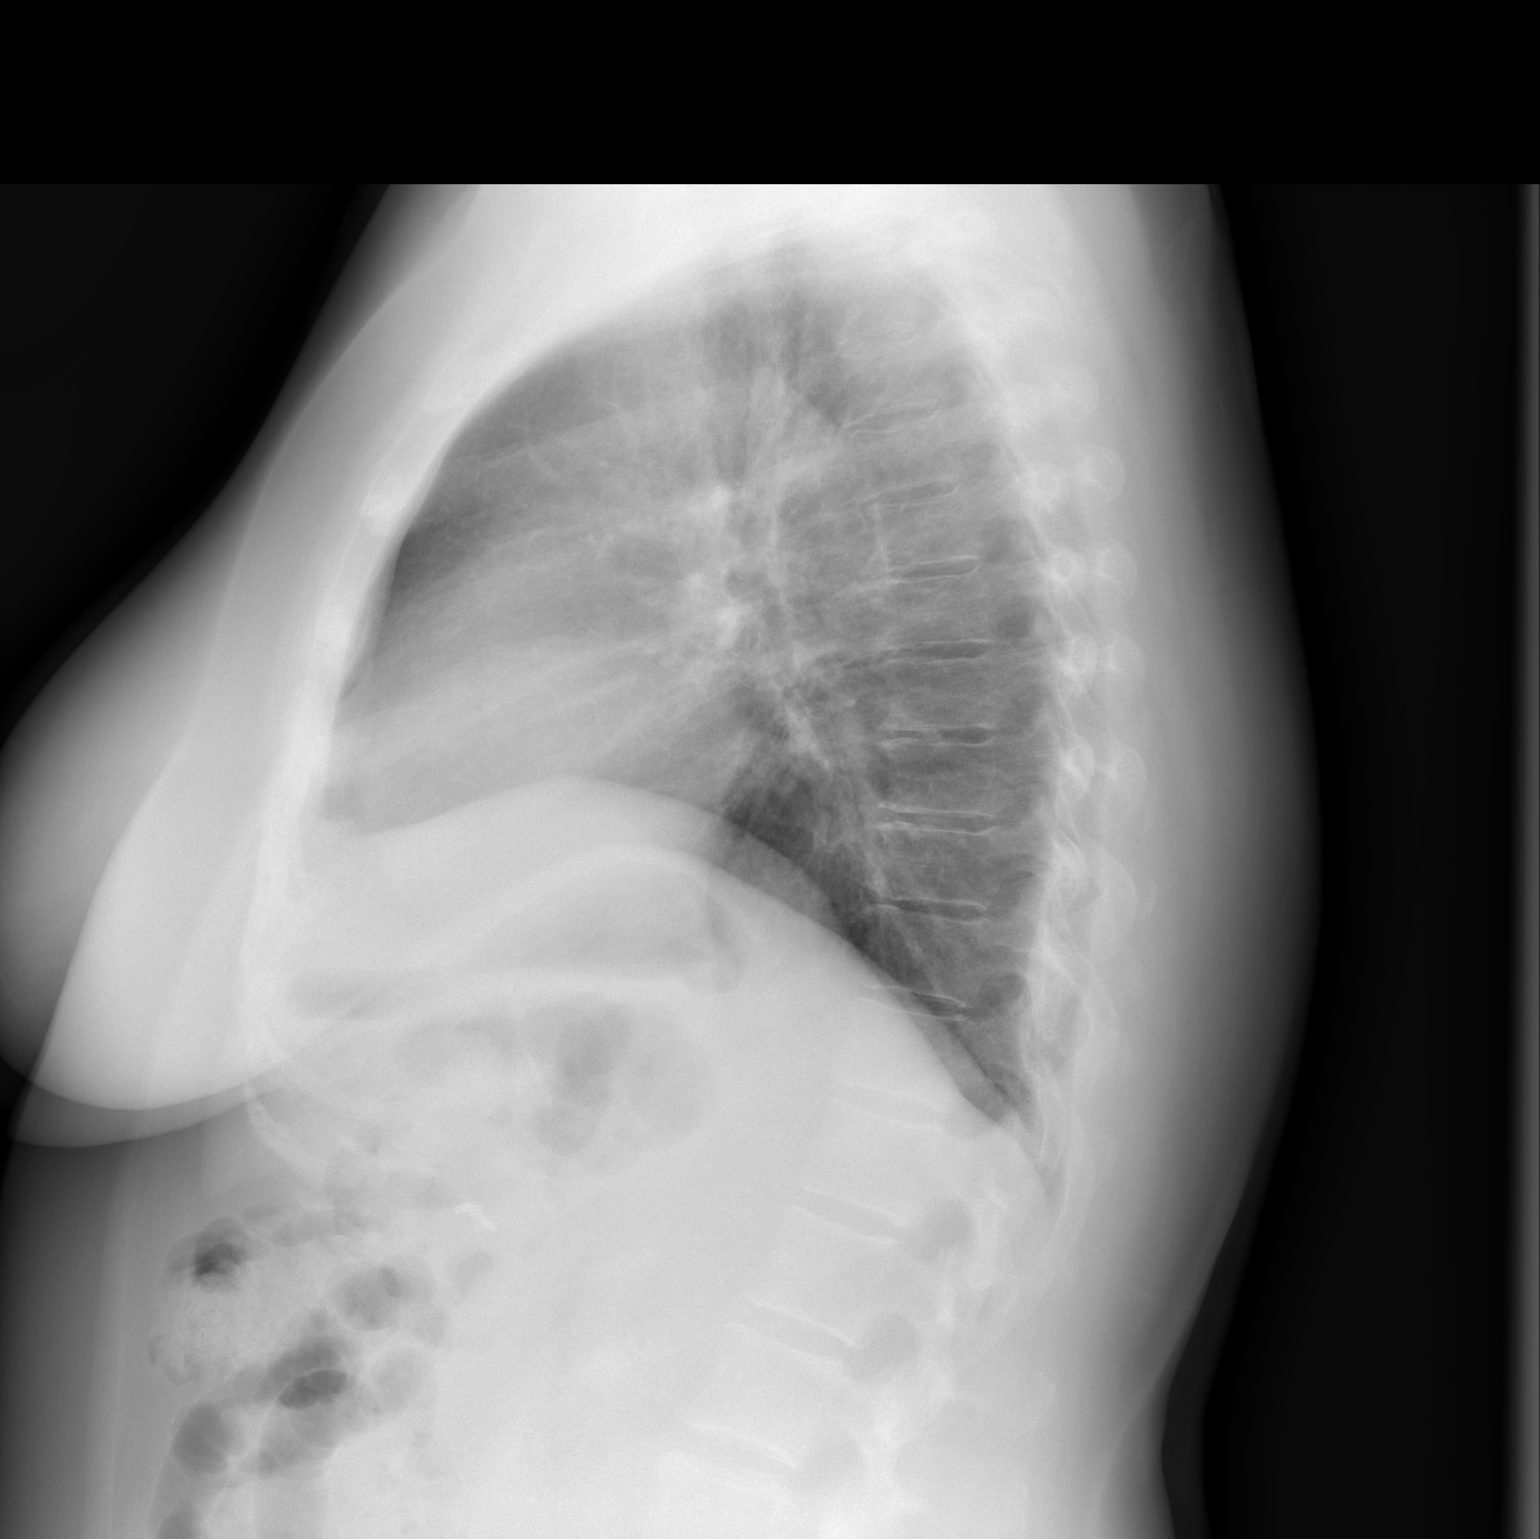

[2 of 2 positions shown; findings below may reference images not displayed]

FINDINGS: Lungs clear.  Heart size and pulmonary vascularity are
normal.  No adenopathy.  No bone lesions.
IMPRESSION: No edema or consolidation.

## 2012-04-17 NOTE — Patient Instructions (Signed)
Plan: Chest Xray today Will call with results Follow-up with GI for overdue colonoscopy Return to clinic in 12 months.  Follow up with Dr. Greta Doom in 6 months at which time a Pap test should be collected.   HAPPY HOLIDAYS!!   Thank you very much Ms. Glenda Powers for allowing me to provide care for you today.  I appreciate your confidence in choosing our Gynecologic Oncology team.  If you have any questions about your visit today please call our office and we will get back to you as soon as possible.  Maryclare Labrador. Stevens Magwood MD., PhD Gynecologic Oncology

## 2012-04-17 NOTE — Progress Notes (Signed)
Office Visit:  Gyn Onc  HPI:  Glenda Powers is a 71 y.o. year old initially seen in consultation on 08/16/2011 for FIGO grade 1 endometrial cancer.  She then underwent a robotic total laparoscopic hysterectomy bilateral salpingo-oophorectomy right lymph node dissection on 09/06/2011 without complications.  Her postoperative course was uncomplicated.  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  She is feeling well. She reports a cough productive of clear sputum for two months.  Reports weight gain.  No changes is bowel habits.  Reports rectal spotting yesterday.    Review of systems: Constitutional:   She has no fever or chills. Cardiovascular: No chest pain, palpitations or edema. Respiratory:  No shortness of breath, wheezing or cough Gastrointestinal: She has normal bowel movements without diarrhea or constipation. She denies any nausea or vomiting. Reprts an episode of rectal bleeding yesterday Genitourinary:  She denies pelvic pain, pelvic pressure or changes in her urinary function. She has no hematuria, dysuria, or incontinence. She has some recent vaginal bleeding, but otherwise denies vaginal discharge Musculoskeletal: Denies muscle weakness or joint pains.  Neurological: Reports numbness of the inner right thigh    Physical Exam: Blood pressure 148/76, pulse 84, temperature 98.4 F (36.9 C), temperature source Oral, resp. rate 20, height 5' 1.77" (1.569 m). General: Well dressed, well nourished in no apparent distress.   HEENT:  Normocephalic and atraumatic, no lesions.   Lungs:  Clear to auscultation bilaterally.  No wheezes. Cardiovascular:  Regular rate and rhythm.  No murmurs or rubs. Abdomen:  Soft, nontender, nondistended.  No palpable masses.  No hepatosplenomegaly.  No ascites. Normal bowel sounds.  No hernias. Robotic port sites are  nontender without any erythema Genitourinary: Normal EGBUS  Vaginal cuff intact.   Rectal:  External hemorrhoids, no rectal masses, soft stool in vault Extremities: No cyanosis, clubbing or edema.  No calf tenderness or erythema. No palpable cords.  Assessment:    71 y.o. year old with Stage 1A Grade 1 endometrioid endometrial cancer.   S/p robotic laparoscopic hysterectomy bilateral salpingo-oophorectomy right pelvic lymph node dissection on 09/06/1998. Absent LVSI, minimal myometrial invasion, and negative lymph nodes.   Plan: CXR PA and Lat today  Patient will f/u with GI for overdue colonoscopy Return to clinic in 12 months.  Follow up with Dr. Greta Doom in 6 months at which time a Pap test should be collected.

## 2012-04-17 NOTE — Telephone Encounter (Signed)
Patient notified of chest xray results.  No concerns or questions voiced.  Instructed to call for any needs.

## 2012-08-06 ENCOUNTER — Encounter: Payer: Self-pay | Admitting: Internal Medicine

## 2012-10-02 ENCOUNTER — Encounter: Payer: Medicare Other | Admitting: Internal Medicine

## 2012-10-31 ENCOUNTER — Ambulatory Visit (AMBULATORY_SURGERY_CENTER): Payer: Medicare Other | Admitting: *Deleted

## 2012-10-31 VITALS — Ht 62.0 in | Wt 178.8 lb

## 2012-10-31 DIAGNOSIS — Z1211 Encounter for screening for malignant neoplasm of colon: Secondary | ICD-10-CM

## 2012-10-31 MED ORDER — MOVIPREP 100 G PO SOLR: 1.0000 | Freq: Once | ORAL | Status: DC

## 2012-10-31 NOTE — Progress Notes (Signed)
No egg or soy allergy. ewm Pt has had post op

## 2012-10-31 NOTE — Progress Notes (Signed)
No egg or soy allergy. ewm Pt has had post op N/V in the past but no other problems with sedation. ewm No home 02 use. ewm Previous GI hx in [place] > 10 years ago. Pt states this colon was normal and she doesn't remember MD to obtain records. ewm

## 2012-11-14 ENCOUNTER — Ambulatory Visit (AMBULATORY_SURGERY_CENTER): Payer: Medicare Other | Admitting: Internal Medicine

## 2012-11-14 ENCOUNTER — Encounter: Payer: Self-pay | Admitting: Internal Medicine

## 2012-11-14 VITALS — BP 138/83 | HR 63 | Temp 97.6°F | Resp 24 | Ht 62.0 in | Wt 178.0 lb

## 2012-11-14 DIAGNOSIS — Z1211 Encounter for screening for malignant neoplasm of colon: Secondary | ICD-10-CM

## 2012-11-14 MED ORDER — SODIUM CHLORIDE 0.9 % IV SOLN: 500.0000 mL | INTRAVENOUS | Status: DC

## 2012-11-14 NOTE — Op Note (Signed)
[facility] [address] [place], [address]   COLONOSCOPY PROCEDURE REPORT  PATIENT: Glenda Powers, Glenda Powers  MR#: [id number] BIRTHDATE: 28-May-1940 , 72  yrs. old GENDER: Female ENDOSCOPIST: Hart Carwin, MD REFERRED BY:Dr  Lodema Hong, M.D. , Dr Nelly Rout PROCEDURE DATE:  11/14/2012 PROCEDURE:   Colonoscopy, screening ASA CLASS:   Class II INDICATIONS:Average risk patient for colon cancer and prior colonoscopy in [place] about 10 years ago, hx of endometrial cancer 2013. MEDICATIONS: MAC sedation, administered by CRNA and propofol (Diprivan) 200mg  IV  DESCRIPTION OF PROCEDURE:   After the risks and benefits and of the procedure were explained, informed consent was obtained.  A digital rectal exam revealed no abnormalities of the rectum.    The LB [id number]-[id number]  endoscope was introduced through the anus and advanced to the cecum, which was identified by both the appendix and ileocecal valve .  The quality of the prep was good, using MoviPrep .  The instrument was then slowly withdrawn as the colon was fully examined.     COLON FINDINGS: Mild diverticulosis was noted throughout the entire examined colon.     Retroflexed views revealed no abnormalities. The scope was then withdrawn from the patient and the procedure completed.  COMPLICATIONS: There were no complications. ENDOSCOPIC IMPRESSION: Mild diverticulosis was noted throughout the entire examined colon  RECOMMENDATIONS: High fiber diet   REPEAT EXAM: In 10 year(s)  for Colonoscopy.  cc:  _______________________________ eSignedHart Carwin, MD 11/14/2012 9:01 AM

## 2012-11-14 NOTE — Patient Instructions (Addendum)
YOU HAD AN ENDOSCOPIC PROCEDURE TODAY AT THE [facility]: Refer to the procedure report that was given to you for any specific questions about what was found during the examination.  If the procedure report does not answer your questions, please call your gastroenterologist to clarify.  If you requested that your care partner not be given the details of your procedure findings, then the procedure report has been included in a sealed envelope for you to review at your convenience later.  YOU SHOULD EXPECT: Some feelings of bloating in the abdomen. Passage of more gas than usual.  Walking can help get rid of the air that was put into your GI tract during the procedure and reduce the bloating. If you had a lower endoscopy (such as a colonoscopy or flexible sigmoidoscopy) you may notice spotting of blood in your stool or on the toilet paper. If you underwent a bowel prep for your procedure, then you may not have a normal bowel movement for a few days.  DIET: Your first meal following the procedure should be a light meal and then it is ok to progress to your normal diet.  A half-sandwich or bowl of soup is an example of a good first meal.  Heavy or fried foods are harder to digest and may make you feel nauseous or bloated.  Likewise meals heavy in dairy and vegetables can cause extra gas to form and this can also increase the bloating.  Drink plenty of fluids but you should avoid alcoholic beverages for 24 hours.  ACTIVITY: Your care partner should take you home directly after the procedure.  You should plan to take it easy, moving slowly for the rest of the day.  You can resume normal activity the day after the procedure however you should NOT DRIVE or use heavy machinery for 24 hours (because of the sedation medicines used during the test).    SYMPTOMS TO REPORT IMMEDIATELY: A gastroenterologist can be reached at any hour.  During normal business hours, 8:30 AM to 5:00 PM Monday through Friday,  call [phone].  After hours and on weekends, please call the GI answering service at [phone] who will take a message and have the physician on call contact you.   Following lower endoscopy (colonoscopy or flexible sigmoidoscopy):  Excessive amounts of blood in the stool  Significant tenderness or worsening of abdominal pains  Swelling of the abdomen that is new, acute  Call for temperature greater thatn 100.   FOLLOW UP: If any biopsies were taken you will be contacted by phone or by letter within the next 1-3 weeks.  Call your gastroenterologist if you have not heard about the biopsies in 3 weeks.  Our staff will call the home number listed on your records the next business day following your procedure to check on you and address any questions or concerns that you may have at that time regarding the information given to you following your procedure. This is a courtesy call and so if there is no answer at the home number and we have not heard from you through the emergency physician on call, we will assume that you have returned to your regular daily activities without incident.  SIGNATURES/CONFIDENTIALITY: You and/or your care partner have signed paperwork which will be entered into your electronic medical record.  These signatures attest to the fact that that the information above on your After Visit Summary has been reviewed and is understood.  Full responsibility of the confidentiality  of this discharge information lies with you and/or your care-partner.  Diverticulosis and high fiber diet given.

## 2012-11-14 NOTE — Progress Notes (Signed)
Patient did not experience any of the following events: a burn prior to discharge; a fall within the facility; wrong site/side/patient/procedure/implant event; or a hospital transfer or hospital admission upon discharge from the facility. (G8907) Patient did not have preoperative order for IV antibiotic SSI prophylaxis. (G8918)  

## 2012-11-14 NOTE — Progress Notes (Signed)
Pt. tol well. No concerns or complaints voiced. VSS.

## 2012-11-15 ENCOUNTER — Other Ambulatory Visit: Payer: Self-pay | Admitting: Gynecology

## 2012-11-15 ENCOUNTER — Telehealth: Payer: Self-pay | Admitting: *Deleted

## 2012-11-15 NOTE — Telephone Encounter (Signed)
  Follow up Call-  Call back number 11/14/2012  Post procedure Call Back phone  # [phone]  Permission to leave phone message Yes     Patient questions:  Do you have a fever, pain , or abdominal swelling? no Pain Score  0 *  Have you tolerated food without any problems? yes  Have you been able to return to your normal activities? yes  Do you have any questions about your discharge instructions: Diet   no Medications  no Follow up visit  no  Do you have questions or concerns about your Care? no  Actions: * If pain score is 4 or above: No action needed, pain <4.

## 2013-11-21 ENCOUNTER — Other Ambulatory Visit: Payer: Self-pay | Admitting: Gynecology

## 2013-11-22 LAB — CYTOLOGY - PAP

## 2013-11-27 ENCOUNTER — Other Ambulatory Visit: Payer: Self-pay | Admitting: Gynecology

## 2013-11-27 ENCOUNTER — Ambulatory Visit
Admission: RE | Admit: 2013-11-27 | Discharge: 2013-11-27 | Disposition: A | Discharge status: Home / Self Care | Payer: Medicare Other | Source: Ambulatory Visit | Attending: Gynecology | Admitting: Gynecology

## 2013-11-27 DIAGNOSIS — R05 Cough: Secondary | ICD-10-CM

## 2013-11-27 DIAGNOSIS — R053 Chronic cough: Secondary | ICD-10-CM

## 2013-11-27 IMAGING — CR DG CHEST 2V
2 series · 2 of 2 positions shown · non-contrast
Comparison: 04/17/2012

CLINICAL DATA: Chronic cough for several years

EXAM:
CHEST  2 VIEW

[view not recorded (1 of 2)]
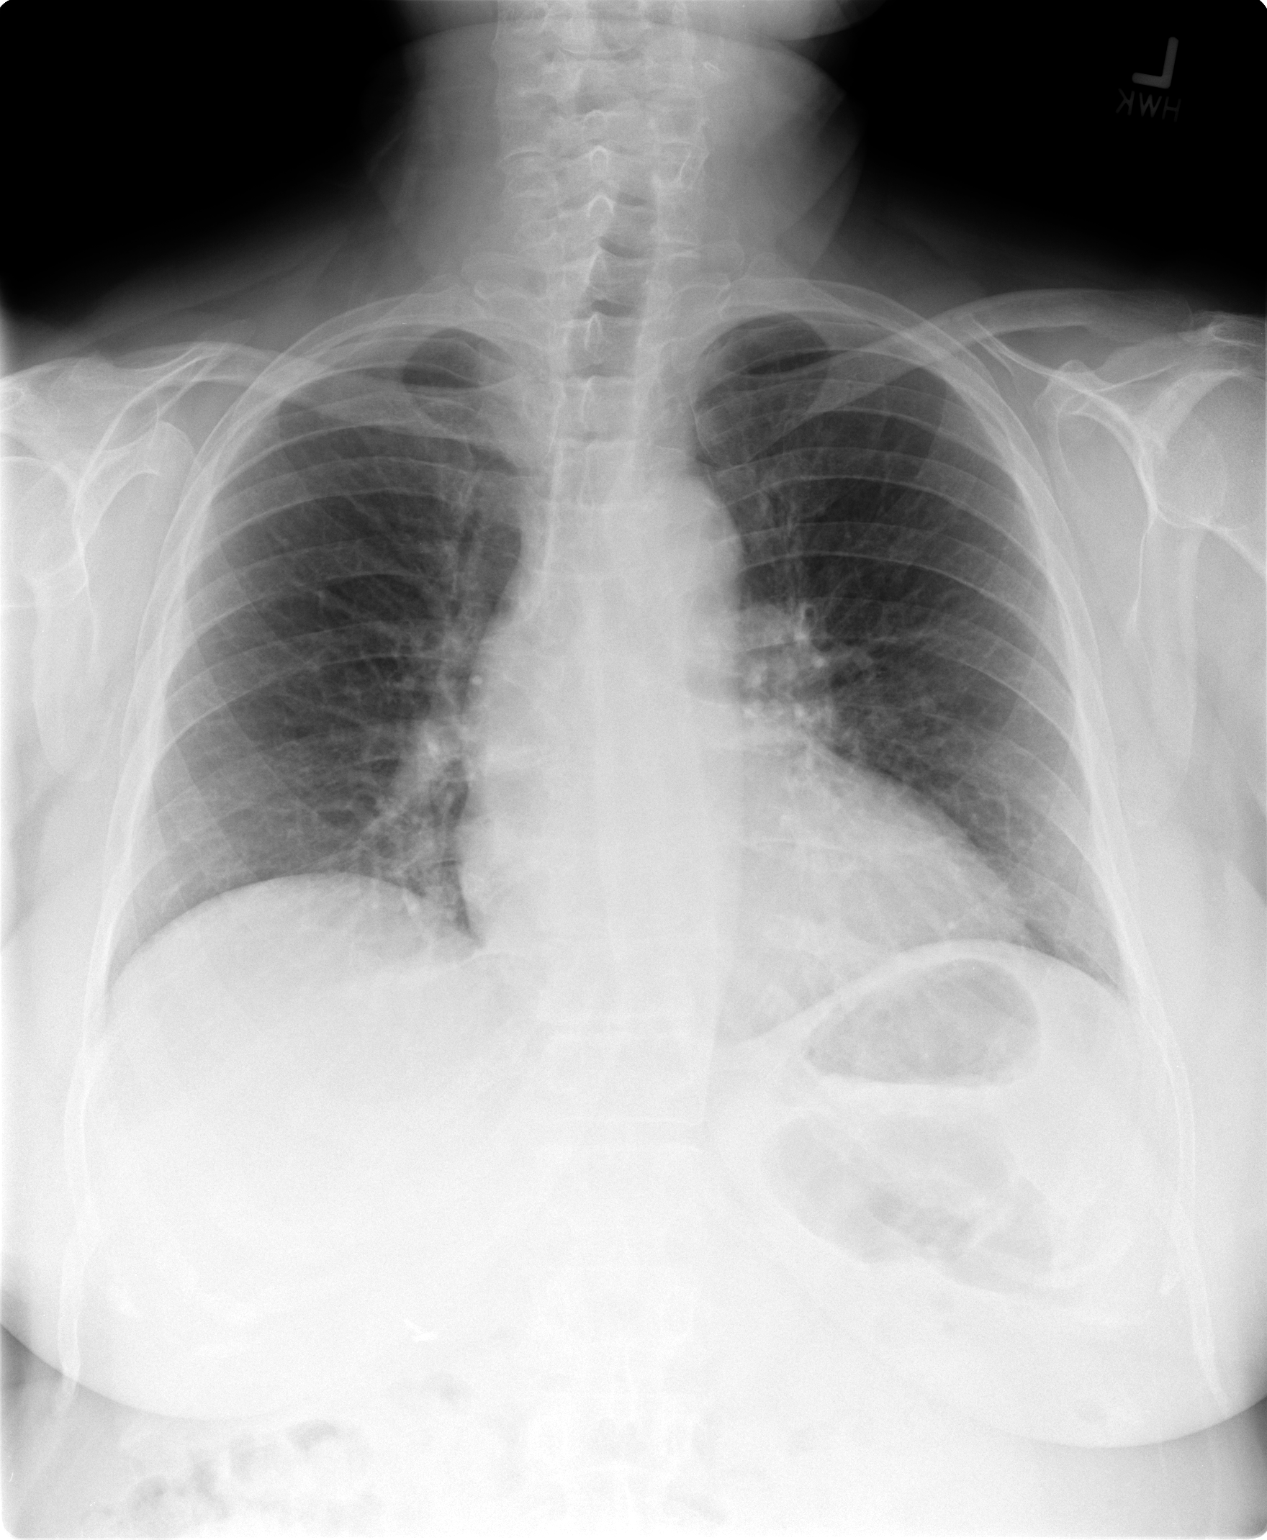

[view not recorded (2 of 2)]
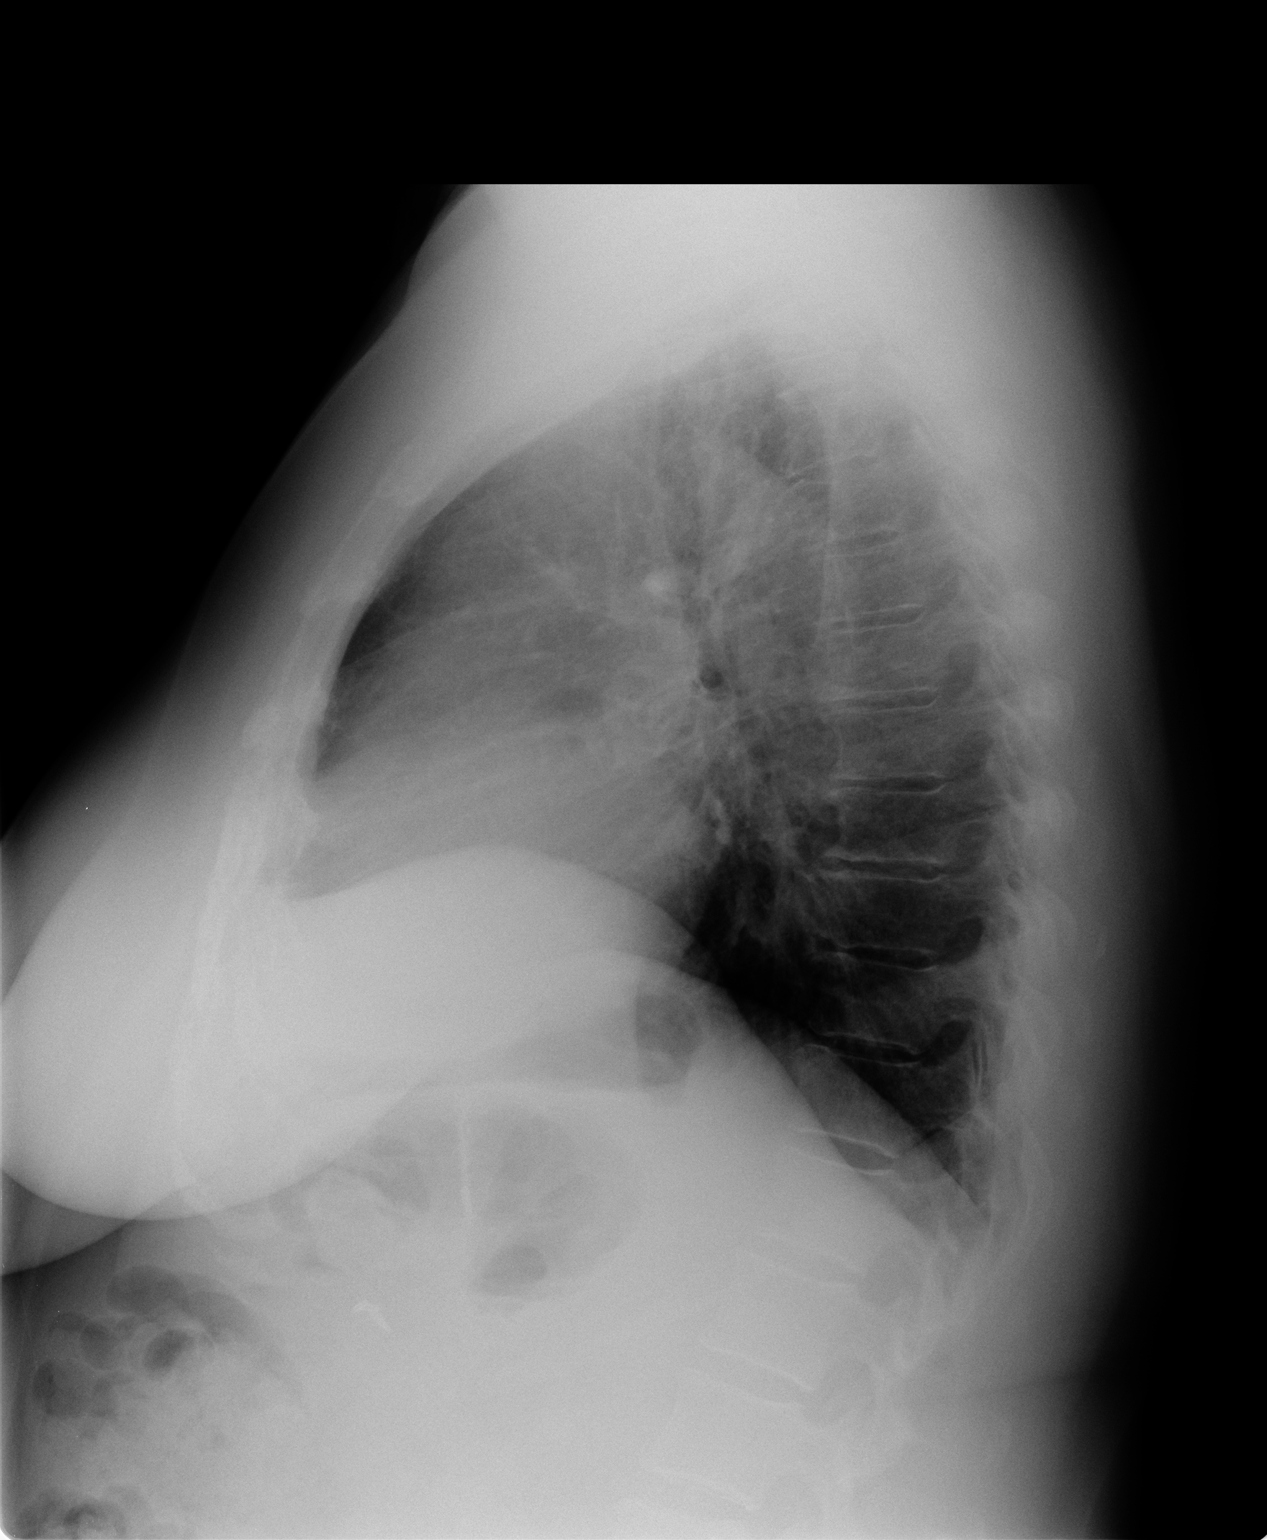

[2 of 2 positions shown; findings below may reference images not displayed]

FINDINGS: The heart size and mediastinal contours are within normal limits.
Both lungs are clear. The visualized skeletal structures are
unremarkable.
IMPRESSION: No active cardiopulmonary disease.

## 2014-06-11 ENCOUNTER — Telehealth: Payer: Self-pay | Admitting: Gynecologic Oncology

## 2014-06-11 NOTE — Telephone Encounter (Signed)
  Office Visit: Gyn Onc   Chief complaint:   HPI: Glenda Powers is a 74 y.o. initially seen in consultation on 08/16/2011 for FIGO grade 1 endometrial cancer. She then underwent a robotic total laparoscopic hysterectomy bilateral salpingo-oophorectomy right lymph node dissection on 31/51/7616 without complications. Her postoperative course was uncomplicated. 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   Review of systems: Constitutional: She has no fever or chills. Cardiovascular: No chest pain, palpitations or edema. Respiratory: No shortness of breath, wheezing or cough Gastrointestinal: She has normal bowel movements without diarrhea or constipation. She denies any nausea or vomiting. Reprts an episode of rectal bleeding yesterday Genitourinary: She denies pelvic pain, pelvic pressure or changes in her urinary function. She has no hematuria, dysuria, or incontinence. She has some recent vaginal bleeding, but otherwise denies vaginal discharge Musculoskeletal: Denies muscle weakness or joint pains.  Neurological: Reports numbness of the inner right thigh    Physical Exam: . General: Well dressed, well nourished in no apparent distress.  HEENT: Normocephalic and atraumatic, no lesions.  Lungs: Clear to auscultation bilaterally. No wheezes. Cardiovascular: Regular rate and rhythm. No murmurs or rubs. Abdomen: Soft, nontender, nondistended. No palpable masses. No hepatosplenomegaly. No ascites. Normal bowel sounds. No hernias. Robotic port sites are nontender without any erythema Genitourinary: Normal EGBUS Vaginal cuff intact.  Rectal: External hemorrhoids, no rectal masses, soft stool in vault Extremities: No cyanosis, clubbing or edema. No calf tenderness or erythema. No palpable cords.  Assessment:  74 y.o.   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 S/p robotic laparoscopic hysterectomy bilateral salpingo-oophorectomy right pelvic lymph node dissection on 09/06/1998. Absent LVSI, minimal myometrial invasion, and negative lymph nodes.   Plan:

## 2014-06-12 ENCOUNTER — Encounter (HOSPITAL_BASED_OUTPATIENT_CLINIC_OR_DEPARTMENT_OTHER): Payer: Self-pay | Admitting: *Deleted

## 2014-06-12 ENCOUNTER — Emergency Department (HOSPITAL_BASED_OUTPATIENT_CLINIC_OR_DEPARTMENT_OTHER)
Admission: EM | Admit: 2014-06-12 | Discharge: 2014-06-12 | Disposition: A | Discharge status: Home / Self Care | Payer: Medicare Other | Attending: Emergency Medicine | Admitting: Emergency Medicine

## 2014-06-12 ENCOUNTER — Ambulatory Visit: Payer: Medicare Other | Attending: Gynecologic Oncology | Admitting: Gynecologic Oncology

## 2014-06-12 ENCOUNTER — Other Ambulatory Visit: Payer: Self-pay

## 2014-06-12 VITALS — BP 200/78 | HR 69 | Temp 97.7°F | Resp 18 | Ht 62.0 in | Wt 183.6 lb

## 2014-06-12 DIAGNOSIS — H269 Unspecified cataract: Secondary | ICD-10-CM | POA: Diagnosis not present

## 2014-06-12 DIAGNOSIS — I10 Essential (primary) hypertension: Secondary | ICD-10-CM

## 2014-06-12 DIAGNOSIS — I159 Secondary hypertension, unspecified: Secondary | ICD-10-CM

## 2014-06-12 DIAGNOSIS — Z8542 Personal history of malignant neoplasm of other parts of uterus: Secondary | ICD-10-CM | POA: Insufficient documentation

## 2014-06-12 DIAGNOSIS — Z79899 Other long term (current) drug therapy: Secondary | ICD-10-CM | POA: Diagnosis not present

## 2014-06-12 DIAGNOSIS — E669 Obesity, unspecified: Secondary | ICD-10-CM

## 2014-06-12 DIAGNOSIS — C541 Malignant neoplasm of endometrium: Secondary | ICD-10-CM | POA: Diagnosis not present

## 2014-06-12 MED ORDER — HYDROCHLOROTHIAZIDE 25 MG PO TABS: 12.5000 mg | ORAL_TABLET | Freq: Every day | ORAL | Status: AC

## 2014-06-12 MED ORDER — ALPRAZOLAM 0.5 MG PO TABS
0.2500 mg | ORAL_TABLET | Freq: Once | ORAL | Status: AC
  Administered 2014-06-12: 0.25 mg via ORAL
  Filled 2014-06-12: qty 1

## 2014-06-12 MED ORDER — CLONIDINE HCL 0.1 MG PO TABS
0.3000 mg | ORAL_TABLET | Freq: Once | ORAL | Status: AC
  Administered 2014-06-12: 0.3 mg via ORAL
  Filled 2014-06-12: qty 3

## 2014-06-12 NOTE — ED Notes (Signed)
MD at bedside. 

## 2014-06-12 NOTE — Progress Notes (Addendum)
  Office Visit: Gyn Onc   Chief complaint: Endometrial cancer surveillance   HPI: Glenda Powers is a 74 y.o. initially seen in consultation on 08/16/2011 for FIGO grade 1 endometrial cancer. She then underwent a robotic total laparoscopic hysterectomy bilateral salpingo-oophorectomy right lymph node dissection on 16/83/7290 without complications. Her postoperative course was uncomplicated. 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   Review of systems: Constitutional: She has no fever or chills. Cardiovascular: No chest pain, palpitations or edema.no headache Respiratory: No shortness of breath, wheezing or cough Gastrointestinal: She has normal bowel movements without diarrhea or constipation. She denies any nausea or vomiting.  Genitourinary: She denies pelvic pain, pelvic pressure or changes in her urinary function. She has no hematuria, dysuria, or incontinence.  Musculoskeletal: Denies muscle weakness or joint pains.     Physical Exam: BP 200/78 mmHg  Pulse 69  Temp(Src) 97.7 F (36.5 C) (Oral)  Resp 18  Ht 5\' 2"  (1.575 m)  Wt 183 lb 9.6 oz (83.28 kg)  BMI 33.57 kg/m2  General: Well dressed, well nourished in no apparent distress.  HEENT: Normocephalic and atraumatic, no lesions.  Lungs: Clear to auscultation bilaterally. No wheezes. Cardiovascular: Regular rate and rhythm. No murmurs or rubs. Abdomen: Soft, nontender, nondistended. No palpable masses. No hepatosplenomegaly. No ascites. Normal bowel sounds. No hernias. Robotic port sites are nontender without any erythema Genitourinary: Normal EGBUS Vaginal cuff intact.  Rectal: External hemorrhoids, no rectal masses, soft stool in vault Extremities: No cyanosis, clubbing or edema. No calf tenderness or erythema. No palpable cords.  Assessment:  74  y.o.  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 S/p robotic laparoscopic hysterectomy bilateral salpingo-oophorectomy right pelvic lymph node dissection on 09/06/1998. Absent LVSI, minimal myometrial invasion, and negative lymph nodes.   Plan: Follow-up in 1 year Pap at next visit.  HTN: Nervous for the visit. Repeat value 208/94 Will follow-up with PCP or urgent care.  No HA, CP, SOB Has a plan for weight loss  Obesity Aware of importance for weight control Plan to start a diet in 1 hour.

## 2014-06-12 NOTE — ED Notes (Addendum)
Pt smiling in nad, denies any c/o.

## 2014-06-12 NOTE — ED Notes (Signed)
Was at her MD's office and her BP was elevated. She was advised to come here to have it checked. No symptoms.

## 2014-06-12 NOTE — Discharge Instructions (Signed)
How to Take Your Blood Pressure HOW DO I GET A BLOOD PRESSURE MACHINE?  You can buy an electronic home blood pressure machine at your local pharmacy. Insurance will sometimes cover the cost if you have a prescription.  Ask your doctor what type of machine is best for you. There are different machines for your arm and your wrist.  If you decide to buy a machine to check your blood pressure on your arm, first check the size of your arm so you can buy the right size cuff. To check the size of your arm:   Use a measuring tape that shows both inches and centimeters.   Wrap the measuring tape around the upper-middle part of your arm. You may need someone to help you measure.   Write down your arm measurement in both inches and centimeters.   To measure your blood pressure correctly, it is important to have the right size cuff.   If your arm is up to 13 inches (up to 34 centimeters), get an adult cuff size.  If your arm is 13 to 17 inches (35 to 44 centimeters), get a large adult cuff size.    If your arm is 17 to 20 inches (45 to 52 centimeters), get an adult thigh cuff.  WHAT DO THE NUMBERS MEAN?   There are two numbers that make up your blood pressure. For example: 120/80.  The first number (120 in our example) is called the "systolic pressure." It is a measure of the pressure in your blood vessels when your heart is pumping blood.  The second number (80 in our example) is called the "diastolic pressure." It is a measure of the pressure in your blood vessels when your heart is resting between beats.  Your doctor will tell you what your blood pressure should be. WHAT SHOULD I DO BEFORE I CHECK MY BLOOD PRESSURE?   Try to rest or relax for at least 30 minutes before you check your blood pressure.  Do not smoke.  Do not have any drinks with caffeine, such as:  Soda.  Coffee.  Tea.  Check your blood pressure in a quiet room.  Sit down and stretch out your arm on a table.  Keep your arm at about the level of your heart. Let your arm relax.  Make sure that your legs are not crossed. HOW DO I CHECK MY BLOOD PRESSURE?  Follow the directions that came with your machine.  Make sure you remove any tight-fighting clothing from your arm or wrist. Wrap the cuff around your upper arm or wrist. You should be able to fit a finger between the cuff and your arm. If you cannot fit a finger between the cuff and your arm, it is too tight and should be removed and rewrapped.  Some units require you to manually pump up the arm cuff.  Automatic units inflate the cuff when you press a button.  Cuff deflation is automatic in both models.  After the cuff is inflated, the unit measures your blood pressure and pulse. The readings are shown on a monitor. Hold still and breathe normally while the cuff is inflated.  Getting a reading takes less than a minute.  Some models store readings in a memory. Some provide a printout of readings. If your machine does not store your readings, keep a written record.  Take readings with you to your next visit with your doctor. Document Released: 03/31/2008 Document Revised: 09/02/2013 Document Reviewed: 06/13/2013 [employer] Patient Information  2015 [employer]. This information is not intended to replace advice given to you by your health care provider. Make sure you discuss any questions you have with your health care provider.  Hypertension Hypertension, commonly called high blood pressure, is when the force of blood pumping through your arteries is too strong. Your arteries are the blood vessels that carry blood from your heart throughout your body. A blood pressure reading consists of a higher number over a lower number, such as 110/72. The higher number (systolic) is the pressure inside your arteries when your heart pumps. The lower number (diastolic) is the pressure inside your arteries when your heart relaxes. Ideally you want your blood  pressure below 120/80. Hypertension forces your heart to work harder to pump blood. Your arteries may become narrow or stiff. Having hypertension puts you at risk for heart disease, stroke, and other problems.  RISK FACTORS Some risk factors for high blood pressure are controllable. Others are not.  Risk factors you cannot control include:   Race. You may be at higher risk if you are African American.  Age. Risk increases with age.  Gender. Men are at higher risk than women before age 20 years. After age 35, women are at higher risk than men. Risk factors you can control include:  Not getting enough exercise or physical activity.  Being overweight.  Getting too much fat, sugar, calories, or salt in your diet.  Drinking too much alcohol. SIGNS AND SYMPTOMS Hypertension does not usually cause signs or symptoms. Extremely high blood pressure (hypertensive crisis) may cause headache, anxiety, shortness of breath, and nosebleed. DIAGNOSIS  To check if you have hypertension, your health care provider will measure your blood pressure while you are seated, with your arm held at the level of your heart. It should be measured at least twice using the same arm. Certain conditions can cause a difference in blood pressure between your right and left arms. A blood pressure reading that is higher than normal on one occasion does not mean that you need treatment. If one blood pressure reading is high, ask your health care provider about having it checked again. TREATMENT  Treating high blood pressure includes making lifestyle changes and possibly taking medicine. Living a healthy lifestyle can help lower high blood pressure. You may need to change some of your habits. Lifestyle changes may include:  Following the DASH diet. This diet is high in fruits, vegetables, and whole grains. It is low in salt, red meat, and added sugars.  Getting at least 2 hours of brisk physical activity every week.  Losing  weight if necessary.  Not smoking.  Limiting alcoholic beverages.  Learning ways to reduce stress. If lifestyle changes are not enough to get your blood pressure under control, your health care provider may prescribe medicine. You may need to take more than one. Work closely with your health care provider to understand the risks and benefits. HOME CARE INSTRUCTIONS  Have your blood pressure rechecked as directed by your health care provider.   Take medicines only as directed by your health care provider. Follow the directions carefully. Blood pressure medicines must be taken as prescribed. The medicine does not work as well when you skip doses. Skipping doses also puts you at risk for problems.   Do not smoke.   Monitor your blood pressure at home as directed by your health care provider. SEEK MEDICAL CARE IF:   You think you are having a reaction to medicines taken.  You have recurrent headaches or feel dizzy.  You have swelling in your ankles.  You have trouble with your vision. SEEK IMMEDIATE MEDICAL CARE IF:  You develop a severe headache or confusion.  You have unusual weakness, numbness, or feel faint.  You have severe chest or abdominal pain.  You vomit repeatedly.  You have trouble breathing. MAKE SURE YOU:   Understand these instructions.  Will watch your condition.  Will get help right away if you are not doing well or get worse. Document Released: 04/18/2005 Document Revised: 09/02/2013 Document Reviewed: 02/08/2013 [facility] Patient Information 2015 [place], [place]. This information is not intended to replace advice given to you by your health care provider. Make sure you discuss any questions you have with your health care provider.

## 2014-06-12 NOTE — Addendum Note (Signed)
Addended byJanie Morning on: 06/12/2014 01:09 PM   Modules accepted: Level of Service

## 2014-06-12 NOTE — ED Provider Notes (Signed)
CSN: [id number]     Arrival date & time 06/12/14  1408 History   First MD Initiated Contact with Patient 06/12/14 1421     Chief Complaint  Patient presents with  . Hypertension     (Consider location/radiation/quality/duration/timing/severity/associated sxs/prior Treatment) Patient is a 74 y.o. female presenting with hypertension. The history is provided by the patient.  Hypertension This is a chronic problem. The current episode started more than 1 week ago. The problem occurs constantly. The problem has not changed since onset.Pertinent negatives include no chest pain, no abdominal pain, no headaches and no shortness of breath. Nothing aggravates the symptoms. Nothing relieves the symptoms. She has tried nothing for the symptoms. The treatment provided no relief.    Past Medical History  Diagnosis Date  . Hypertension   . Endometrioid carcinoma   . PONV (postoperative nausea and vomiting)     with gallbladder surgery  . Cataract     early signs   Past Surgical History  Procedure Laterality Date  . Melanoma excision  2012    from top of head  . Cholecystectomy      18 years ago  . Tonsillectomy      as a child  . Abdominal hysterectomy  09/06/2011    Robotic Total laparoscopic hysterectomy , BSO  right LND  . Colonoscopy      >10 yrs ago in [place]- was normal per pt   Family History  Problem Relation Age of Onset  . Cancer Maternal Aunt   . Colon cancer Neg Hx   . Stomach cancer Neg Hx   . Rectal cancer Neg Hx    History  Substance Use Topics  . Smoking status: Never Smoker   . Smokeless tobacco: Never Used  . Alcohol Use: 1.6 oz/week    1 Glasses of wine, 2 Standard drinks or equivalent per week     Comment: occassional   OB History    No data available     Review of Systems  Constitutional: Negative for fever and fatigue.  HENT: Negative for congestion and drooling.   Eyes: Negative for pain.  Respiratory: Negative for cough and shortness of breath.    Cardiovascular: Negative for chest pain.  Gastrointestinal: Negative for nausea, vomiting, abdominal pain and diarrhea.  Genitourinary: Negative for dysuria and hematuria.  Musculoskeletal: Negative for back pain, gait problem and neck pain.  Skin: Negative for color change.  Neurological: Negative for dizziness and headaches.  Hematological: Negative for adenopathy.  Psychiatric/Behavioral: Negative for behavioral problems.  All other systems reviewed and are negative.     Allergies  Review of patient's allergies indicates no known allergies.  Home Medications   Prior to Admission medications   Medication Sig Start Date End Date Taking? Authorizing Provider  atorvastatin (LIPITOR) 10 MG tablet Take 10 mg by mouth every evening.     Historical Provider, MD  ibuprofen (ADVIL,MOTRIN) 200 MG tablet Take 200 mg by mouth every 6 (six) hours as needed.    Historical Provider, MD  lisinopril (PRINIVIL,ZESTRIL) 10 MG tablet Take 10 mg by mouth daily with breakfast.     Historical Provider, MD  psyllium (REGULOID) 0.52 G capsule Take 2 capsules by mouth daily with breakfast.     Historical Provider, MD  ranitidine (ZANTAC) 150 MG tablet Take 150 mg by mouth daily.    Historical Provider, MD   BP 215/142 mmHg  Pulse 90  Temp(Src) 98.2 F (36.8 C) (Oral)  Resp 20  Ht 5\' 2"  (1.575  m)  Wt 183 lb (83.008 kg)  BMI 33.46 kg/m2  SpO2 95% Physical Exam  Constitutional: She is oriented to person, place, and time. She appears well-developed and well-nourished.  HENT:  Head: Normocephalic and atraumatic.  Mouth/Throat: Oropharynx is clear and moist. No oropharyngeal exudate.  Eyes: Conjunctivae and EOM are normal. Pupils are equal, round, and reactive to light.  Neck: Normal range of motion. Neck supple.  Cardiovascular: Normal rate, regular rhythm, normal heart sounds and intact distal pulses.  Exam reveals no gallop and no friction rub.   No murmur heard. Pulmonary/Chest: Effort normal and  breath sounds normal. No respiratory distress. She has no wheezes.  Abdominal: Soft. Bowel sounds are normal. There is no tenderness. There is no rebound and no guarding.  Musculoskeletal: Normal range of motion. She exhibits no edema or tenderness.  Neurological: She is alert and oriented to person, place, and time. She has normal strength. No sensory deficit. Coordination and gait normal.  Skin: Skin is warm and dry.  Psychiatric: She has a normal mood and affect. Her behavior is normal.  Nursing note and vitals reviewed.   ED Course  Procedures (including critical care time) Labs Review Labs Reviewed - No data to display  Imaging Review No results found.   EKG Interpretation   Date/Time:  Thursday June 12 2014 14:26:24 EST Ventricular Rate:  85 PR Interval:  142 QRS Duration: 72 QT Interval:  352 QTC Calculation: 418 R Axis:   38 Text Interpretation:  Sinus rhythm with Premature supraventricular  complexes Otherwise no significant change Confirmed by Charolett Yarrow  MD,  Kalieb Freeland ([id number]) on 06/12/2014 2:36:33 PM      MDM   Final diagnoses:  Essential hypertension    2:36 PM 74 y.o. female with history of hypertension on lisinopril who presents from her oncologist office with a elevated blood pressure. She's not have a blood pressure 200/78. She denies any chest pain, shortness of breath, headache, vision changes, or other symptoms. She is afebrile and vital signs are otherwise unremarkable here. EKG is unremarkable. No need for further workup per ACEP guidelines on asymptomatic hypertension. Will treat the patient with a clonidine and a Xanax due to anxiety. She states she had a previous appointment with a doctor about 1-2 weeks ago and her systolic blood pressure was in the 190s.  3:31 PM: BP titrating down w/ clonidine.  I have discussed the diagnosis/risks/treatment options with the patient and family and believe the pt to be eligible for discharge home to follow-up with and  establish w/ a pcp. We also discussed returning to the ED immediately if new or worsening sx occur. We discussed the sx which are most concerning (e.g., sob, cp, HA, visual disturbance) that necessitate immediate return. Medications administered to the patient during their visit and any new prescriptions provided to the patient are listed below.  Medications given during this visit Medications  cloNIDine (CATAPRES) tablet 0.3 mg (0.3 mg Oral Given 06/12/14 1441)  ALPRAZolam (XANAX) tablet 0.25 mg (0.25 mg Oral Given 06/12/14 1441)    New Prescriptions   HYDROCHLOROTHIAZIDE (HYDRODIURIL) 25 MG TABLET    Take 0.5 tablets (12.5 mg total) by mouth daily.       Pamella Pert, MD 06/12/14 1534

## 2014-06-12 NOTE — Patient Instructions (Signed)
Please follow-up with your PCP Follow-up with Gyn Onc in 1 year Pap at next visit  Obesity  Your BMI is Body mass index is 33.57 kg/(m^2).  Obesity is defined as having too much total body fat and a body mass index (BMI) of 30 or more. BMI is an estimate of body fat and is calculated from your height and weight. Obesity happens when you consume more calories than you can burn by exercising or performing daily physical tasks. Prolonged obesity can cause major illnesses or emergencies, such as:   Stroke.  Heart disease.  Diabetes.  Cancer.  Arthritis.  High blood pressure (hypertension).  High cholesterol.  Sleep apnea.  Erectile dysfunction.  Infertility problems. CAUSES   Regularly eating unhealthy foods.  Physical inactivity.  Certain disorders, such as an underactive thyroid (hypothyroidism), Cushing's syndrome, and polycystic ovarian syndrome.  Certain medicines, such as steroids, some depression medicines, and antipsychotics.  Genetics.  Lack of sleep. DIAGNOSIS  A health care provider can diagnose obesity after calculating your BMI. Obesity will be diagnosed if your BMI is 30 or higher.  There are other methods of measuring obesity levels. Some other methods include measuring your skinfold thickness, your waist circumference, and comparing your hip circumference to your waist circumference. TREATMENT  A healthy treatment program includes some or all of the following:  Long-term dietary changes.  Exercise and physical activity.  Behavioral and lifestyle changes.  Medicine only under the supervision of your health care provider. Medicines may help, but only if they are used with diet and exercise programs. An unhealthy treatment program includes:  Fasting.  Fad diets.  Supplements and drugs. These choices do not succeed in long-term weight control.  HOME CARE INSTRUCTIONS   Exercise and perform physical activity as directed by your health care  provider. To increase physical activity, try the following:  Use stairs instead of elevators.  Park farther away from store entrances.  Garden, bike, or walk instead of watching television or using the computer.  Eat healthy, low-calorie foods and drinks on a regular basis. Eat more fruits and vegetables. Use low-calorie cookbooks or take healthy cooking classes.  Limit fast food, sweets, and processed snack foods.  Eat smaller portions.  Keep a daily journal of everything you eat. There are many free websites to help you with this. It may be helpful to measure your foods so you can determine if you are eating the correct portion sizes.  Avoid drinking alcohol. Drink more water and drinks without calories.  Take vitamins and supplements only as recommended by your health care provider.  Weight-loss support groups, Tax adviser, counselors, and stress reduction education can also be very helpful. SEEK IMMEDIATE MEDICAL CARE IF:  You have chest pain or tightness.  You have trouble breathing or feel short of breath.  You have weakness or leg numbness.  You feel confused or have trouble talking.  You have sudden changes in your vision. MAKE SURE YOU:  Understand these instructions.  Will watch your condition.  Will get help right away if you are not doing well or get worse. Document Released: 05/26/2004 Document Revised: 09/02/2013 Document Reviewed: 05/25/2011 [facility] Patient Information 2015 [place], [place]. This information is not intended to replace advice given to you by your health care provider. Make sure you discuss any questions you have with your health care provider.  Hypertension Hypertension, commonly called high blood pressure, is when the force of blood pumping through your arteries is too strong. Your arteries are the  blood vessels that carry blood from your heart throughout your body. A blood pressure reading consists of a higher number over a lower  number, such as 110/72. The higher number (systolic) is the pressure inside your arteries when your heart pumps. The lower number (diastolic) is the pressure inside your arteries when your heart relaxes. Ideally you want your blood pressure below 120/80. Hypertension forces your heart to work harder to pump blood. Your arteries may become narrow or stiff. Having hypertension puts you at risk for heart disease, stroke, and other problems.  RISK FACTORS Some risk factors for high blood pressure are controllable. Others are not.  Risk factors you cannot control include:   Race. You may be at higher risk if you are African American.  Age. Risk increases with age.  Gender. Men are at higher risk than women before age 51 years. After age 63, women are at higher risk than men. Risk factors you can control include:  Not getting enough exercise or physical activity.  Being overweight.  Getting too much fat, sugar, calories, or salt in your diet.  Drinking too much alcohol. SIGNS AND SYMPTOMS Hypertension does not usually cause signs or symptoms. Extremely high blood pressure (hypertensive crisis) may cause headache, anxiety, shortness of breath, and nosebleed. DIAGNOSIS  To check if you have hypertension, your health care provider will measure your blood pressure while you are seated, with your arm held at the level of your heart. It should be measured at least twice using the same arm. Certain conditions can cause a difference in blood pressure between your right and left arms. A blood pressure reading that is higher than normal on one occasion does not mean that you need treatment. If one blood pressure reading is high, ask your health care provider about having it checked again. TREATMENT  Treating high blood pressure includes making lifestyle changes and possibly taking medicine. Living a healthy lifestyle can help lower high blood pressure. You may need to change some of your habits. Lifestyle  changes may include:  Following the DASH diet. This diet is high in fruits, vegetables, and whole grains. It is low in salt, red meat, and added sugars.  Getting at least 2 hours of brisk physical activity every week.  Losing weight if necessary.  Not smoking.  Limiting alcoholic beverages.  Learning ways to reduce stress. If lifestyle changes are not enough to get your blood pressure under control, your health care provider may prescribe medicine. You may need to take more than one. Work closely with your health care provider to understand the risks and benefits. HOME CARE INSTRUCTIONS  Have your blood pressure rechecked as directed by your health care provider.   Take medicines only as directed by your health care provider. Follow the directions carefully. Blood pressure medicines must be taken as prescribed. The medicine does not work as well when you skip doses. Skipping doses also puts you at risk for problems.   Do not smoke.   Monitor your blood pressure at home as directed by your health care provider. SEEK MEDICAL CARE IF:   You think you are having a reaction to medicines taken.  You have recurrent headaches or feel dizzy.  You have swelling in your ankles.  You have trouble with your vision. SEEK IMMEDIATE MEDICAL CARE IF:  You develop a severe headache or confusion.  You have unusual weakness, numbness, or feel faint.  You have severe chest or abdominal pain.  You vomit repeatedly.  You  have trouble breathing. MAKE SURE YOU:   Understand these instructions.  Will watch your condition.  Will get help right away if you are not doing well or get worse. Document Released: 04/18/2005 Document Revised: 09/02/2013 Document Reviewed: 02/08/2013 [facility] Patient Information 2015 [place], [place]. This information is not intended to replace advice given to you by your health care provider. Make sure you discuss any questions you have with your health care  provider.

## 2014-09-25 ENCOUNTER — Ambulatory Visit: Payer: Medicare Other | Admitting: Internal Medicine

## 2014-11-25 ENCOUNTER — Other Ambulatory Visit: Payer: Self-pay | Admitting: Obstetrics & Gynecology

## 2014-11-27 LAB — CYTOLOGY - PAP

## 2015-06-04 ENCOUNTER — Encounter: Payer: Self-pay | Admitting: Gynecologic Oncology

## 2015-06-04 ENCOUNTER — Ambulatory Visit: Payer: Medicare Other | Attending: Gynecologic Oncology | Admitting: Gynecologic Oncology

## 2015-06-04 VITALS — BP 160/72 | HR 69 | Temp 98.0°F | Resp 18 | Ht 62.0 in | Wt 161.8 lb

## 2015-06-04 DIAGNOSIS — E669 Obesity, unspecified: Secondary | ICD-10-CM | POA: Insufficient documentation

## 2015-06-04 DIAGNOSIS — C541 Malignant neoplasm of endometrium: Secondary | ICD-10-CM | POA: Diagnosis present

## 2015-06-04 DIAGNOSIS — E119 Type 2 diabetes mellitus without complications: Secondary | ICD-10-CM | POA: Diagnosis not present

## 2015-06-04 NOTE — Progress Notes (Signed)
  Office Visit: Gyn Onc   Chief complaint: Endometrial cancer surveillance   HPI: Glenda Powers is a 75 y.o. initially seen in consultation on 08/16/2011 for FIGO grade 1 endometrial cancer. She then underwent a robotic total laparoscopic hysterectomy bilateral salpingo-oophorectomy right lymph node dissection on [id number] without complications. Her postoperative course was uncomplicated. 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  Diagnosed with DM 6 months ago.  20 pound weight loss with changes in her diet.   Review of systems: Constitutional: She has no fever or chills.reports weight loss Cardiovascular: No chest pain, palpitations or edema.no headache Respiratory: No shortness of breath, wheezing or cough Gastrointestinal: She has normal bowel movements without diarrhea or constipation. She denies any nausea or vomiting. No diarrhea Genitourinary: She denies pelvic pain, pelvic pressure or changes in her urinary function. She has no hematuria, dysuria, or incontinence.  Musculoskeletal: Denies muscle weakness or joint pains.     Physical Exam: BP 160/72 mmHg  Pulse 69  Temp(Src) 98 F (36.7 C) (Oral)  Resp 18  Ht 5\' 2"  (1.575 m)  Wt 161 lb 12.8 oz (73.392 kg)  BMI 29.59 kg/m2  SpO2 99%  General: Well dressed, well nourished in no apparent distress.  HEENT: Normocephalic and atraumatic, no lesions.  Lungs: Clear to auscultation bilaterally. No wheezes. Cardiovascular: Regular rate and rhythm. No murmurs or rubs. Abdomen: Soft, nontender, nondistended. No palpable masses. No hepatosplenomegaly. No ascites. Normal bowel sounds. No hernias. Robotic port sites are nontender without any erythema Genitourinary: Normal EGBUS Vaginal cuff intact.  Rectal: External hemorrhoids, no rectal masses, soft stool in  vault Extremities: No cyanosis, clubbing or edema. No calf tenderness or erythema. No palpable cords.   Past Medical History  Diagnosis Date  . Hypertension   . Endometrioid carcinoma   . PONV (postoperative nausea and vomiting)     with gallbladder surgery  . Cataract     early signs   Social History Occasionally walks her son's dog. Assessment:  75 y.o.  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 S/p robotic laparoscopic hysterectomy bilateral salpingo-oophorectomy right pelvic lymph node dissection on 09/06/1998. Absent LVSI, minimal myometrial invasion, and negative lymph nodes. NED   Plan: Follow-up for Pap with Dr. Stann Mainland F/U with Gyn Onc PRN   HTN: Nervous for the visit. On Lisonopril   Obesity Aware of importance for weight control On a sugar free diet.

## 2015-06-04 NOTE — Patient Instructions (Signed)
Please call for any questions or concerns and continue to follow up with Dr. Stann Mainland.

## 2015-11-30 ENCOUNTER — Other Ambulatory Visit: Payer: Self-pay | Admitting: Obstetrics & Gynecology

## 2015-12-01 LAB — CYTOLOGY - PAP

## 2015-12-03 ENCOUNTER — Other Ambulatory Visit: Payer: Self-pay | Admitting: Obstetrics & Gynecology

## 2015-12-03 DIAGNOSIS — E2839 Other primary ovarian failure: Secondary | ICD-10-CM

## 2015-12-03 DIAGNOSIS — R928 Other abnormal and inconclusive findings on diagnostic imaging of breast: Secondary | ICD-10-CM

## 2015-12-07 ENCOUNTER — Ambulatory Visit
Admission: RE | Admit: 2015-12-07 | Discharge: 2015-12-07 | Disposition: A | Discharge status: Home / Self Care | Payer: Medicare Other | Source: Ambulatory Visit | Attending: Obstetrics & Gynecology | Admitting: Obstetrics & Gynecology

## 2015-12-07 DIAGNOSIS — R928 Other abnormal and inconclusive findings on diagnostic imaging of breast: Secondary | ICD-10-CM

## 2015-12-16 ENCOUNTER — Ambulatory Visit
Admission: RE | Admit: 2015-12-16 | Discharge: 2015-12-16 | Disposition: A | Discharge status: Home / Self Care | Payer: Medicare Other | Source: Ambulatory Visit | Attending: Obstetrics & Gynecology | Admitting: Obstetrics & Gynecology

## 2015-12-16 DIAGNOSIS — E2839 Other primary ovarian failure: Secondary | ICD-10-CM
